# Patient Record
Sex: Female | Born: 1965 | Race: White | Hispanic: No | Marital: Married | Smoking: Never smoker
Health system: Southern US, Community
[De-identification: ages and names within clinical notes are randomized; demographics above are authoritative.]

## PROBLEM LIST (undated history)

## (undated) DIAGNOSIS — M1990 Unspecified osteoarthritis, unspecified site: Secondary | ICD-10-CM

## (undated) DIAGNOSIS — F329 Major depressive disorder, single episode, unspecified: Secondary | ICD-10-CM

## (undated) DIAGNOSIS — F32A Depression, unspecified: Secondary | ICD-10-CM

## (undated) HISTORY — PX: BUNIONECTOMY: SHX129

## (undated) HISTORY — DX: Unspecified osteoarthritis, unspecified site: M19.90

## (undated) HISTORY — PX: TUBAL LIGATION: SHX77

---

## 1997-09-30 ENCOUNTER — Other Ambulatory Visit: Admission: RE | Admit: 1997-09-30 | Discharge: 1997-09-30 | Payer: Self-pay | Admitting: Obstetrics and Gynecology

## 1998-07-15 ENCOUNTER — Inpatient Hospital Stay (HOSPITAL_COMMUNITY): Admission: AD | Admit: 1998-07-15 | Discharge: 1998-07-17 | Payer: Self-pay | Admitting: Obstetrics and Gynecology

## 1998-08-24 ENCOUNTER — Other Ambulatory Visit: Admission: RE | Admit: 1998-08-24 | Discharge: 1998-08-24 | Payer: Self-pay | Admitting: Obstetrics and Gynecology

## 1999-09-30 ENCOUNTER — Other Ambulatory Visit: Admission: RE | Admit: 1999-09-30 | Discharge: 1999-09-30 | Payer: Self-pay | Admitting: Obstetrics and Gynecology

## 2000-10-03 ENCOUNTER — Other Ambulatory Visit: Admission: RE | Admit: 2000-10-03 | Discharge: 2000-10-03 | Payer: Self-pay | Admitting: Obstetrics and Gynecology

## 2001-09-26 ENCOUNTER — Other Ambulatory Visit: Admission: RE | Admit: 2001-09-26 | Discharge: 2001-09-26 | Payer: Self-pay | Admitting: Obstetrics and Gynecology

## 2002-10-14 ENCOUNTER — Other Ambulatory Visit: Admission: RE | Admit: 2002-10-14 | Discharge: 2002-10-14 | Payer: Self-pay | Admitting: Obstetrics and Gynecology

## 2003-06-09 ENCOUNTER — Encounter: Admission: RE | Admit: 2003-06-09 | Discharge: 2003-06-09 | Payer: Self-pay | Admitting: Sports Medicine

## 2003-06-23 ENCOUNTER — Encounter: Admission: RE | Admit: 2003-06-23 | Discharge: 2003-06-23 | Payer: Self-pay | Admitting: Sports Medicine

## 2003-07-14 ENCOUNTER — Encounter: Admission: RE | Admit: 2003-07-14 | Discharge: 2003-07-14 | Payer: Self-pay | Admitting: Sports Medicine

## 2003-07-23 ENCOUNTER — Encounter: Admission: RE | Admit: 2003-07-23 | Discharge: 2003-07-23 | Payer: Self-pay | Admitting: Sports Medicine

## 2003-08-04 ENCOUNTER — Encounter: Admission: RE | Admit: 2003-08-04 | Discharge: 2003-08-04 | Payer: Self-pay | Admitting: Sports Medicine

## 2003-09-01 ENCOUNTER — Encounter: Admission: RE | Admit: 2003-09-01 | Discharge: 2003-09-01 | Payer: Self-pay | Admitting: Sports Medicine

## 2003-09-29 ENCOUNTER — Ambulatory Visit: Payer: Self-pay | Admitting: Sports Medicine

## 2003-11-18 ENCOUNTER — Ambulatory Visit: Payer: Self-pay | Admitting: Sports Medicine

## 2003-11-30 ENCOUNTER — Other Ambulatory Visit: Admission: RE | Admit: 2003-11-30 | Discharge: 2003-11-30 | Payer: Self-pay | Admitting: Obstetrics and Gynecology

## 2004-10-24 ENCOUNTER — Other Ambulatory Visit: Admission: RE | Admit: 2004-10-24 | Discharge: 2004-10-24 | Payer: Self-pay | Admitting: Obstetrics and Gynecology

## 2004-12-07 ENCOUNTER — Ambulatory Visit: Payer: Self-pay | Admitting: Sports Medicine

## 2005-09-20 ENCOUNTER — Ambulatory Visit: Payer: Self-pay | Admitting: Sports Medicine

## 2009-04-21 ENCOUNTER — Ambulatory Visit: Payer: Self-pay | Admitting: Sports Medicine

## 2009-04-21 DIAGNOSIS — M7742 Metatarsalgia, left foot: Secondary | ICD-10-CM

## 2009-04-21 DIAGNOSIS — M7741 Metatarsalgia, right foot: Secondary | ICD-10-CM | POA: Insufficient documentation

## 2009-04-21 DIAGNOSIS — R269 Unspecified abnormalities of gait and mobility: Secondary | ICD-10-CM | POA: Insufficient documentation

## 2009-06-25 ENCOUNTER — Encounter: Payer: Self-pay | Admitting: Sports Medicine

## 2010-02-03 NOTE — Letter (Signed)
Summary: [facility]   Imported By: Marily Memos 07/14/2009 12:18:39  _____________________________________________________________________  External Attachment:    Type:   Image     Comment:   External Document

## 2010-02-03 NOTE — Assessment & Plan Note (Signed)
Summary: orthotics,mc   History of Present Illness: Patient has a history of bilateral bunions; s/p surgical repairs. Not complaining of bunion pain today. She notes fallen arches on standing with some abulatory dyscomfort of feet relieved by rest. Her gait is abnormal 2/2 to her acquired foot/ankle deformities. No paresthesias.   Physical Exam  General:  Well-developed,well-nourished,in no acute distress; alert,appropriate and cooperative throughout examination Msk:  ANKLES/FEET: Pes planus. Excessive pronation. Slight weakness on right plant/inv; otherwise nl strength.  GAIT: Forefoot landing on running. Slight out-turning right foot on running; decreased on orthotic insertion.   Impression & Recommendations:  Problem # 1:  OTHER ACQUIRED DEFORMITY OF ANKLE AND FOOT OTHER (ICD-736.79)  The patient was fitted for a standard, cushioned, size 10 CAMBRAY orthotic. The orthotic was subsequently heated. The patient was placed on the orthotic blank positioned on the orthotic stand. The patient was positioned in subtalar neutral position in a weight bearing stance. After completion of molding, a stable blue EVA base was applied to the orthotic blank. The blank was ground to a stable position for weight bearing.  - Daily heel raises and pidgeon-toe walking. Transition toward heel raises on stairs as tolerated.  - RTC as needed for any concerns.  Orders: [employer], each unit [phone]) Orthotic Materials, each unit [phone])  Problem # 2:  ABNORMALITY OF GAIT (ICD-781.2)  - Per item #1.  Orders: [employer], each unit [phone])

## 2010-10-04 ENCOUNTER — Ambulatory Visit (INDEPENDENT_AMBULATORY_CARE_PROVIDER_SITE_OTHER): Payer: Managed Care, Other (non HMO) | Admitting: Sports Medicine

## 2010-10-04 ENCOUNTER — Encounter: Payer: Self-pay | Admitting: Sports Medicine

## 2010-10-04 DIAGNOSIS — M216X9 Other acquired deformities of unspecified foot: Secondary | ICD-10-CM

## 2010-10-04 DIAGNOSIS — M79661 Pain in right lower leg: Secondary | ICD-10-CM

## 2010-10-04 DIAGNOSIS — R269 Unspecified abnormalities of gait and mobility: Secondary | ICD-10-CM

## 2010-10-04 DIAGNOSIS — M79609 Pain in unspecified limb: Secondary | ICD-10-CM

## 2010-10-04 NOTE — Progress Notes (Signed)
  Subjective:    Patient ID: Kelly Rice, female    DOB: September 17, 1965, 45 y.o.   MRN: [id number]  HPI The patient comes in for new orthotics. She doesn't really have any complaints or any pain, however does have some minimal tightness in her soleus muscle after running. She's currently training 30-40 miles per week and has a [place] marathon coming up in November 4.  On inspection of her old orthotics, I do note that the memory plastic has fractured on both sides.    Review of Systems    no fevers, chills, night sweats, weight loss. Objective:   Physical Exam General: Well-developed, well-nourished Caucasian female in no acute distress. Skin: Warm and dry. Neuro: Alert and oriented x3, extraocular muscles intact. Respiratory:Not using accessory muscles.  Foot inspection and palpation reveals mild breakdown of the transverse arch and a drop of MT heads: Abnormal callous is present: Over first through fifth metatarsal heads No hallux valgus present. Minimal hammertoes. TTP: None  Running gait is neutral.  Patient was fitted for a : standard, cushioned, semi-rigid orthotic. The orthotic was heated and afterward the patient stood on the orthotic blank positioned on the orthotic stand. The patient was positioned in subtalar neutral position and 10 degrees of ankle dorsiflexion in a weight bearing stance. After completion of molding, a stable base was applied to the orthotic blank. The blank was ground to a stable position for weight bearing. Size: 9 Base: Blue EVA Posting: None Additional orthotic padding: None  Running gait continues to be neutral with new orthotics, and there is no pain.    Assessment & Plan:

## 2010-10-04 NOTE — Assessment & Plan Note (Signed)
See above

## 2010-10-04 NOTE — Assessment & Plan Note (Addendum)
Orthotics as above. Calf sleeve bilaterally. No other modification or treatment needed.

## 2011-02-13 ENCOUNTER — Ambulatory Visit (INDEPENDENT_AMBULATORY_CARE_PROVIDER_SITE_OTHER): Payer: Managed Care, Other (non HMO) | Admitting: Family Medicine

## 2011-02-13 VITALS — BP 100/60

## 2011-02-13 DIAGNOSIS — M25569 Pain in unspecified knee: Secondary | ICD-10-CM

## 2011-02-14 DIAGNOSIS — S83209A Unspecified tear of unspecified meniscus, current injury, unspecified knee, initial encounter: Secondary | ICD-10-CM | POA: Insufficient documentation

## 2011-02-14 NOTE — Progress Notes (Signed)
  Subjective:    Patient ID: Kelly Rice, female    DOB: 03-Dec-1965, 46 y.o.   MRN: [id number]  HPI  RIGHT knee pain Had a little pain noted anteriorly about 4 days ago but then did a half marathon and had significant right knee pain the last 2 miles. Was only able to hobble to the finish line. Knee felt swollen and  she had pain with bearing weight the following day.  Today the pain is improving some as the day goes along, it is still swollen. She has not run in the last 2 days. She typically runs 30 miles per week. Her next planned  race is March 17, half marathon. No unusual changes in training recently, nothing unusual about the race she just did. Had completed hill section without pain and was on flat last 2 miles when pain occurred---she "felt/heard" a pop  and noticed immediate pain.  PERTINENT  PMH / PSH: No prior knee injury, no prior knee surgery.  Review of Systems Pertinent review of systems: Denies warmth or redness of the right knee joint and of the right lower extremity.  Notes no numbness or tingling in the right lower extremity.No calf pain, no calf bruising.  Nonsmoker    negative for fever or unusual weight change.  Objective:   Physical Exam  Vital signs reviewed. GENERAL: Well developed, well nourished, no acute distress. Normal body habitus KNEE: Right. Full range of motion in flexion extension and this is painless range of motion. No effusion. No blocking of the kneecap. Quadricep muscle bulk and strength is bilaterally symmetrical. There is no tenderness along the quadriceps or patellar tendon. Neither the medial more the lateral minus tender. There is mild tenderness in the posterior lateral joint line that is somewhat diffuse but definitely not associated with the hamstring. McMurray is negative. Lachman is normal. Ligamentously intact to varus and valgus stress  Left knee FROM, no effusion, no erythema or warmth, ligamentously intact to varus and valgus  stress. MSK: Hamstring strength B 5/5. No ttp over insertion on right. No defect of hamstring. ULTRASOUND:  Patellar and quadricep tendon is intact. Effusion noted in the suprapatellar pouch and also noted at the joint lines bilaterally. The medial meniscus has a questionable pathologic mid substance tear, the lateral meniscus appears intact. Posterior popliteal space on the lateral side is benign. Normal vascular flow in popliteal artery      Assessment & Plan:  #1. Knee pain with significant effusion.  ? Meniscal injury . Given the effusion she mostly has some intra-articular pathology going on. The medial meniscus looked a little odd on ultrasound.  I suspect that is an old finding as she has no tenderness there. McMurray is negative but she does have effusion which could make this falsely negative. We discussed options and she decided to go for conservative treatment for the next week or so. If she continues to improve she'll do it 4-5 mile run in 7 days. If that goes well, then we are done. If that does not go well she will call and we will consider other options which included MRI and corticosteroid injection. I would favor MRI as I think she would. We did discuss that if she does not improve rapidly, the . March 17 race may be out of the question and she is okay with that. She will continue to ICE tid.

## 2011-02-20 ENCOUNTER — Ambulatory Visit (INDEPENDENT_AMBULATORY_CARE_PROVIDER_SITE_OTHER): Payer: Managed Care, Other (non HMO) | Admitting: Family Medicine

## 2011-02-20 ENCOUNTER — Encounter: Payer: Self-pay | Admitting: Family Medicine

## 2011-02-20 VITALS — BP 103/68 | HR 66

## 2011-02-20 DIAGNOSIS — M25561 Pain in right knee: Secondary | ICD-10-CM

## 2011-02-20 DIAGNOSIS — M25569 Pain in unspecified knee: Secondary | ICD-10-CM

## 2011-02-20 NOTE — Progress Notes (Signed)
  Subjective:    Patient ID: Kelly Rice, female    DOB: 1965-10-11, 46 y.o.   MRN: [id number]  HPI  Followup right knee pain. Is about 20-30% better. Still very stiff particularly if she tries to bend it all the way. She can walk without problem but if she tries to even a few steps around and she has fairly intense pain. She's had no new symptoms develop.  Review of Systems   noted no erythema or warmth of the knee. The swelling has improved.  Objective:   Physical Exam  Vital signs reviewed. GENERAL: Well developed, well nourished, no acute distress KNEE: Right knee still has some mild effusion. Posteriorly medially she is tender to palpation but there is no mass here. There is no Baker's cyst. The popliteal spaces otherwise normal. There is mild tenderness on the posterior medial joint line.      Assessment & Plan:  Knee pain with effusion. Long discussion. We also decided to consider MRI. She's taking with her insurance coming to see if she can afford that. She'll let me know. My main concern is for medial meniscal tear.

## 2011-02-22 ENCOUNTER — Ambulatory Visit
Admission: RE | Admit: 2011-02-22 | Discharge: 2011-02-22 | Disposition: A | Discharge status: Home / Self Care | Payer: Managed Care, Other (non HMO) | Source: Ambulatory Visit | Attending: Family Medicine | Admitting: Family Medicine

## 2011-02-22 DIAGNOSIS — M25561 Pain in right knee: Secondary | ICD-10-CM

## 2011-02-23 ENCOUNTER — Telehealth: Payer: Self-pay | Admitting: Family Medicine

## 2011-02-23 DIAGNOSIS — S83209A Unspecified tear of unspecified meniscus, current injury, unspecified knee, initial encounter: Secondary | ICD-10-CM

## 2011-02-23 NOTE — Telephone Encounter (Signed)
Pt returned Dr Donnetta Hail call

## 2011-03-06 ENCOUNTER — Encounter (HOSPITAL_BASED_OUTPATIENT_CLINIC_OR_DEPARTMENT_OTHER): Payer: Self-pay | Admitting: *Deleted

## 2011-03-07 ENCOUNTER — Other Ambulatory Visit: Payer: Self-pay | Admitting: Orthopedic Surgery

## 2011-03-08 ENCOUNTER — Ambulatory Visit (HOSPITAL_BASED_OUTPATIENT_CLINIC_OR_DEPARTMENT_OTHER)
Admission: RE | Admit: 2011-03-08 | Discharge: 2011-03-08 | Disposition: A | Discharge status: Home / Self Care | Payer: Managed Care, Other (non HMO) | Source: Ambulatory Visit | Attending: Orthopedic Surgery | Admitting: Orthopedic Surgery

## 2011-03-08 ENCOUNTER — Ambulatory Visit (HOSPITAL_BASED_OUTPATIENT_CLINIC_OR_DEPARTMENT_OTHER): Payer: Managed Care, Other (non HMO) | Admitting: Anesthesiology

## 2011-03-08 ENCOUNTER — Encounter (HOSPITAL_BASED_OUTPATIENT_CLINIC_OR_DEPARTMENT_OTHER): Payer: Self-pay | Admitting: Anesthesiology

## 2011-03-08 ENCOUNTER — Encounter (HOSPITAL_BASED_OUTPATIENT_CLINIC_OR_DEPARTMENT_OTHER): Payer: Self-pay | Admitting: *Deleted

## 2011-03-08 ENCOUNTER — Encounter (HOSPITAL_BASED_OUTPATIENT_CLINIC_OR_DEPARTMENT_OTHER)
Admission: RE | Disposition: A | Discharge status: Home / Self Care | Payer: Self-pay | Source: Ambulatory Visit | Attending: Orthopedic Surgery

## 2011-03-08 DIAGNOSIS — M1710 Unilateral primary osteoarthritis, unspecified knee: Secondary | ICD-10-CM | POA: Insufficient documentation

## 2011-03-08 DIAGNOSIS — M23329 Other meniscus derangements, posterior horn of medial meniscus, unspecified knee: Secondary | ICD-10-CM | POA: Insufficient documentation

## 2011-03-08 DIAGNOSIS — F329 Major depressive disorder, single episode, unspecified: Secondary | ICD-10-CM | POA: Insufficient documentation

## 2011-03-08 DIAGNOSIS — F3289 Other specified depressive episodes: Secondary | ICD-10-CM | POA: Insufficient documentation

## 2011-03-08 DIAGNOSIS — M2240 Chondromalacia patellae, unspecified knee: Secondary | ICD-10-CM | POA: Insufficient documentation

## 2011-03-08 HISTORY — PX: KNEE ARTHROSCOPY: SHX127

## 2011-03-08 HISTORY — DX: Major depressive disorder, single episode, unspecified: F32.9

## 2011-03-08 HISTORY — DX: Depression, unspecified: F32.A

## 2011-03-08 SURGERY — ARTHROSCOPY, KNEE
Anesthesia: General | Site: Knee | Laterality: Right | Wound class: Clean

## 2011-03-08 MED ORDER — DEXAMETHASONE SODIUM PHOSPHATE 10 MG/ML IJ SOLN
INTRAMUSCULAR | Status: DC | PRN
  Administered 2011-03-08: 10 mg via INTRAVENOUS

## 2011-03-08 MED ORDER — ONDANSETRON HCL 4 MG/2ML IJ SOLN: 4.0000 mg | Freq: Once | INTRAMUSCULAR | Status: DC | PRN

## 2011-03-08 MED ORDER — MIDAZOLAM HCL 5 MG/5ML IJ SOLN
INTRAMUSCULAR | Status: DC | PRN
  Administered 2011-03-08: 2 mg via INTRAVENOUS

## 2011-03-08 MED ORDER — ONDANSETRON HCL 4 MG/2ML IJ SOLN
INTRAMUSCULAR | Status: DC | PRN
  Administered 2011-03-08: 4 mg via INTRAVENOUS

## 2011-03-08 MED ORDER — FENTANYL CITRATE 0.05 MG/ML IJ SOLN
INTRAMUSCULAR | Status: DC | PRN
  Administered 2011-03-08: 100 ug via INTRAVENOUS
  Administered 2011-03-08: 50 ug via INTRAVENOUS

## 2011-03-08 MED ORDER — PROPOFOL 10 MG/ML IV EMUL
INTRAVENOUS | Status: DC | PRN
  Administered 2011-03-08: 300 mg via INTRAVENOUS
  Administered 2011-03-08: 50 mg via INTRAVENOUS

## 2011-03-08 MED ORDER — OXYCODONE-ACETAMINOPHEN 5-325 MG PO TABS: 1.0000 | ORAL_TABLET | Freq: Four times a day (QID) | ORAL | Status: AC | PRN

## 2011-03-08 MED ORDER — OXYCODONE-ACETAMINOPHEN 5-325 MG PO TABS
1.0000 | ORAL_TABLET | ORAL | Status: AC | PRN
  Administered 2011-03-08: 1 via ORAL

## 2011-03-08 MED ORDER — LIDOCAINE HCL (CARDIAC) 20 MG/ML IV SOLN
INTRAVENOUS | Status: DC | PRN
  Administered 2011-03-08: 60 mg via INTRAVENOUS

## 2011-03-08 MED ORDER — LACTATED RINGERS IV SOLN
INTRAVENOUS | Status: DC
  Administered 2011-03-08 (×2): via INTRAVENOUS

## 2011-03-08 MED ORDER — BUPIVACAINE HCL (PF) 0.5 % IJ SOLN
INTRAMUSCULAR | Status: DC | PRN
  Administered 2011-03-08: 20 mL

## 2011-03-08 MED ORDER — POVIDONE-IODINE 7.5 % EX SOLN: Freq: Once | CUTANEOUS | Status: DC

## 2011-03-08 MED ORDER — HYDROMORPHONE HCL PF 1 MG/ML IJ SOLN: 0.2500 mg | INTRAMUSCULAR | Status: DC | PRN

## 2011-03-08 MED ORDER — SODIUM CHLORIDE 0.9 % IR SOLN
Status: DC | PRN
  Administered 2011-03-08 (×2)

## 2011-03-08 MED ORDER — MORPHINE SULFATE 2 MG/ML IJ SOLN: 0.0500 mg/kg | INTRAMUSCULAR | Status: DC | PRN

## 2011-03-08 MED ORDER — CLINDAMYCIN PHOSPHATE 600 MG/50ML IV SOLN: 600.0000 mg | INTRAVENOUS | Status: DC

## 2011-03-08 SURGICAL SUPPLY — 40 items
BANDAGE ELASTIC 6 VELCRO ST LF (GAUZE/BANDAGES/DRESSINGS) ×2 IMPLANT
BLADE 4.2CUDA (BLADE) IMPLANT
BLADE GREAT WHITE 4.2 (BLADE) ×2 IMPLANT
CANISTER OMNI JUG 16 LITER (MISCELLANEOUS) IMPLANT
CANISTER SUCTION 2500CC (MISCELLANEOUS) IMPLANT
CLOTH BEACON ORANGE TIMEOUT ST (SAFETY) ×2 IMPLANT
CUTTER MENISCUS  4.2MM (BLADE)
CUTTER MENISCUS 4.2MM (BLADE) IMPLANT
DRAPE ARTHROSCOPY W/POUCH 114 (DRAPES) ×2 IMPLANT
DRSG EMULSION OIL 3X3 NADH (GAUZE/BANDAGES/DRESSINGS) ×2 IMPLANT
DURAPREP 26ML APPLICATOR (WOUND CARE) ×2 IMPLANT
ELECT MENISCUS 165MM 90D (ELECTRODE) IMPLANT
ELECT REM PT RETURN 9FT ADLT (ELECTROSURGICAL)
ELECTRODE REM PT RTRN 9FT ADLT (ELECTROSURGICAL) IMPLANT
GLOVE BIO SURGEON STRL SZ 6.5 (GLOVE) ×1 IMPLANT
GLOVE BIOGEL PI IND STRL 7.0 (GLOVE) ×1 IMPLANT
GLOVE BIOGEL PI IND STRL 8 (GLOVE) ×2 IMPLANT
GLOVE BIOGEL PI INDICATOR 7.0 (GLOVE) ×1
GLOVE BIOGEL PI INDICATOR 8 (GLOVE) ×2
GLOVE ECLIPSE 7.5 STRL STRAW (GLOVE) ×4 IMPLANT
GOWN BRE IMP PREV XXLGXLNG (GOWN DISPOSABLE) ×2 IMPLANT
GOWN PREVENTION PLUS XLARGE (GOWN DISPOSABLE) ×4 IMPLANT
GOWN PREVENTION PLUS XXLARGE (GOWN DISPOSABLE) ×1 IMPLANT
HOLDER KNEE FOAM BLUE (MISCELLANEOUS) ×2 IMPLANT
KNEE WRAP E Z 3 GEL PACK (MISCELLANEOUS) ×2 IMPLANT
NDL SAFETY ECLIPSE 18X1.5 (NEEDLE) ×1 IMPLANT
NEEDLE HYPO 18GX1.5 SHARP (NEEDLE) ×2
PACK ARTHROSCOPY DSU (CUSTOM PROCEDURE TRAY) ×2 IMPLANT
PACK BASIN DAY SURGERY FS (CUSTOM PROCEDURE TRAY) ×2 IMPLANT
PAD CAST 4YDX4 CTTN HI CHSV (CAST SUPPLIES) ×1 IMPLANT
PADDING CAST COTTON 4X4 STRL (CAST SUPPLIES) ×2
PENCIL BUTTON HOLSTER BLD 10FT (ELECTRODE) IMPLANT
SET ARTHROSCOPY TUBING (MISCELLANEOUS) ×2
SET ARTHROSCOPY TUBING LN (MISCELLANEOUS) ×1 IMPLANT
SPONGE GAUZE 4X4 12PLY (GAUZE/BANDAGES/DRESSINGS) ×2 IMPLANT
SUT ETHILON 4 0 PS 2 18 (SUTURE) IMPLANT
SYR 5ML LL (SYRINGE) ×2 IMPLANT
TOWEL OR 17X24 6PK STRL BLUE (TOWEL DISPOSABLE) ×2 IMPLANT
TOWEL OR NON WOVEN STRL DISP B (DISPOSABLE) ×2 IMPLANT
WATER STERILE IRR 1000ML POUR (IV SOLUTION) ×2 IMPLANT

## 2011-03-08 NOTE — Anesthesia Postprocedure Evaluation (Signed)
  Anesthesia Post-op Note  Patient: Kelly Rice  Procedure(s) Performed: Procedure(s) (LRB): ARTHROSCOPY KNEE (Right)  Patient Location: PACU  Anesthesia Type: General  Level of Consciousness: sedated  Airway and Oxygen Therapy: Patient Spontanous Breathing and Patient connected to nasal cannula oxygen  Post-op Pain: mild  Post-op Assessment: Post-op Vital signs reviewed, Patient's Cardiovascular Status Stable, Respiratory Function Stable, Patent Airway, No signs of Nausea or vomiting and Pain level controlled  Post-op Vital Signs: Reviewed and stable  Complications: No apparent anesthesia complications

## 2011-03-08 NOTE — Anesthesia Procedure Notes (Signed)
Procedure Name: LMA Insertion Date/Time: 03/08/2011 12:14 PM Performed by: Signa Kell Pre-anesthesia Checklist: Patient identified, Emergency Drugs available, Suction available, Patient being monitored and Timeout performed Patient Re-evaluated:Patient Re-evaluated prior to inductionOxygen Delivery Method: Circle System Utilized Preoxygenation: Pre-oxygenation with 100% oxygen Intubation Type: IV induction Ventilation: Mask ventilation without difficulty LMA: LMA inserted LMA Size: 4.0 Number of attempts: 1 Airway Equipment and Method: bite block Placement Confirmation: positive ETCO2 and breath sounds checked- equal and bilateral Tube secured with: Tape Dental Injury: Teeth and Oropharynx as per pre-operative assessment

## 2011-03-08 NOTE — Discharge Instructions (Signed)
POST-OP KNEE ARTHROSCOPY INSTRUCTIONS  Dr. Cherlyn Roberts PA-C  Pain You will be expected to have a moderate amount of pain in the affected knee for approximately two weeks. However, the first two days will be the most severe pain. A prescription has been provided to take as needed for the pain. The pain can be reduced by applying ice packs to the knee for the first 1-2 weeks post surgery. Also, keeping the leg elevated on pillows will help alleviate the pain. If you develop any acute pain or swelling in your calf muscle, please call the doctor.  Activity It is preferred that you stay at bed rest for approximately 24 hours. However, you may go to the bathroom with help. Weight bearing as tolerated. You may begin the knee exercises the day of surgery. Discontinue crutches as the knee pain resolves.  Dressing Keep the dressing dry. If the ace bandage should wrinkle or roll up, this can be rewrapped to prevent ridges in the bandage. You may remove all dressings in 48 hours,  apply bandaids to each wound. You may shower on the 4th day after surgery but no tub bath.  Symptoms to report to your doctor Extreme pain Extreme swelling Temperature above 101 degrees Change in the feeling, color, or movement of your toes Redness, heat, or swelling at your incision  Exercise If is preferred that as soon as possible you try to do a straight leg raise without bending the knee and concentrate on bringing the heel of your foot off the bed up to approximately 45 degrees and hold for the count of 10 seconds. Repeat this at least 10 times three or four times per day. Additional exercises are provided below.  You are encouraged to bend the knee as tolerated.  Follow-Up Call to schedule a follow-up appointment in 5-7 days. Our office # is [phone].  POST-OP EXERCISES  Short Arc Quads  1. Lie on back with legs straight. Place towel roll under thigh, just above knee. 2. Tighten thigh muscles to  straighten knee and lift heel off bed. 3. Hold for slow count of five, then lower. 4. Do three sets of ten    Straight Leg Raises  1. Lie on back with operative leg straight and other leg bent. 2. Keeping operative leg completely straight, slowly lift operative leg so foot is 5 inches off bed. 3. Hold for slow count of five, then lower. 4. Do three sets of ten.    DO BOTH EXERCISES 2 TIMES A DAY  Ankle Pumps  Work/move the operative ankle and foot up and down 10 times every hour while [facility]  [address] [place], [place] [address] [phone]   Post Anesthesia Home Care Instructions  Activity: Get plenty of rest for the remainder of the day. A responsible adult should stay with you for 24 hours following the procedure.  For the next 24 hours, DO NOT: -Drive a car -Advertising copywriter -Drink alcoholic beverages -Take any medication unless instructed by your physician -Make any legal decisions or sign important papers.  Meals: Start with liquid foods such as gelatin or soup. Progress to regular foods as tolerated. Avoid greasy, spicy, heavy foods. If nausea and/or vomiting occur, drink only clear liquids until the nausea and/or vomiting subsides. Call your physician if vomiting continues.  Special Instructions/Symptoms: Your throat may feel dry or sore from the anesthesia or the breathing tube placed in your throat during surgery. If this causes discomfort, gargle with  warm salt water. The discomfort should disappear within 24 hours.  Call your surgeon if you experience:   1.  Fever over 101.0. 2.  Inability to urinate. 3.  Nausea and/or vomiting. 4.  Extreme swelling or bruising at the surgical site. 5.  Continued bleeding from the incision. 6.  Increased pain, redness or drainage from the incision. 7.  Problems related to your pain medication. Marland Kitchen

## 2011-03-08 NOTE — Brief Op Note (Addendum)
03/08/2011  12:59 PM  PATIENT:  Gloriajean Dell  46 y.o. female  PRE-OPERATIVE DIAGNOSIS:  Medial Meniscal Tear Right Knee  POST-OPERATIVE DIAGNOSIS:  Medial Meniscal Tear Right Knee  PROCEDURE:  Procedure(s) (LRB): ARTHROSCOPY KNEE (Right) with medial menisectomy, chondroplasty of medial femoral condyle, and patella  SURGEON:  Surgeon(s) and Role:    * Harvie Junior, MD - Primary  PHYSICIAN ASSISTANT: Bethune PA-C  ANESTHESIA:   general  EBL:  Total I/O In: 1000 [I.V.:1000] Out: -   BLOOD ADMINISTERED:none  DRAINS: none   LOCAL MEDICATIONS USED:  MARCAINE        TOURNIQUET:  * No tourniquets in log *  DICTATION: .Other Dictation: Dictation Number   PLAN OF CARE: Discharge to home after PACU  PATIENT DISPOSITION:  PACU - hemodynamically stable.     Dictation number [id number]

## 2011-03-08 NOTE — Progress Notes (Signed)
Pt became light headed cool and clammy ,  [place] ,    95/50  60 12,  Pale/   Short lived,  Pt much improved at present  68 18 104/60 color good pt states feels much better

## 2011-03-08 NOTE — H&P (Signed)
PREOPERATIVE H&P  Chief Complaint: r. Knee pain  HPI: Kelly Rice is a 46 y.o. female who presents for evaluation of r. Knee pain. It has been present for 3 mon and has been worsening. She has failed conservative measures. Pain is rated as moderate.  Past Medical History  Diagnosis Date  . Depression    Past Surgical History  Procedure Date  . Bunionectomy     BIL  . Tubal ligation    History   Social History  . Marital Status: Married    Spouse Name: N/A    Number of Children: N/A  . Years of Education: N/A   Social History Main Topics  . Smoking status: Never Smoker   . Smokeless tobacco: Never Used  . Alcohol Use: Yes  . Drug Use: None  . Sexually Active: Yes   Other Topics Concern  . None   Social History Narrative  . None   History reviewed. No pertinent family history. Allergies  Allergen Reactions  . Codeine Nausea Only  . Penicillins Rash   Prior to Admission medications   Medication Sig Start Date End Date Taking? Authorizing Provider  buPROPion (WELLBUTRIN SR) 150 MG 12 hr tablet Take 150 mg by mouth every morning.   Yes Historical Provider, MD  FLUoxetine (PROZAC) 20 MG capsule Take 20 mg by mouth daily.   Yes Historical Provider, MD     Positive ROS: none  All other systems have been reviewed and were otherwise negative with the exception of those mentioned in the HPI and as above.  Physical Exam: Filed Vitals:   03/08/11 1009  BP: 139/82  Pulse: 73  Temp: 98 F (36.7 C)  Resp: 18    General: Alert, no acute distress Cardiovascular: No pedal edema Respiratory: No cyanosis, no use of accessory musculature GI: No organomegaly, abdomen is soft and non-tender Skin: No lesions in the area of chief complaint Neurologic: Sensation intact distally Psychiatric: Patient is competent for consent with normal mood and affect Lymphatic: No axillary or cervical lymphadenopathy  MUSCULOSKELETAL: R Knee: +TTP med + mcmurray  -instability  MRI: + mmt  Assessment/Plan: mmt right knee Plan for Procedure(s): ARTHROSCOPY KNEE  The risks benefits and alternatives were discussed with the patient including but not limited to the risks of nonoperative treatment, versus surgical intervention including infection, bleeding, nerve injury, malunion, nonunion, hardware prominence, hardware failure, need for hardware removal, blood clots, cardiopulmonary complications, morbidity, mortality, among others, and they were willing to proceed.  Predicted outcome is good, although there will be at least a six to nine month expected recovery.  Jahmire Ruffins L, MD 03/08/2011 11:29 AM

## 2011-03-08 NOTE — Anesthesia Preprocedure Evaluation (Addendum)
Anesthesia Evaluation  Patient identified by MRN, date of birth, ID band Patient awake    Reviewed: Allergy & Precautions, H&P , NPO status , Patient's Chart, lab work & pertinent test results  Airway  TM Distance: >3 FB Neck ROM: Full    Dental  (+) Teeth Intact and Dental Advisory Given   Pulmonary  breath sounds clear to auscultation        Cardiovascular Rhythm:Regular Rate:Normal     Neuro/Psych    GI/Hepatic   Endo/Other    Renal/GU      Musculoskeletal   Abdominal   Peds  Hematology   Anesthesia Other Findings   Reproductive/Obstetrics                           Anesthesia Physical Anesthesia Plan  ASA: I  Anesthesia Plan: General   Post-op Pain Management:    Induction: Intravenous  Airway Management Planned: LMA  Additional Equipment:   Intra-op Plan:   Post-operative Plan: Extubation in OR  Informed Consent: I have reviewed the patients History and Physical, chart, labs and discussed the procedure including the risks, benefits and alternatives for the proposed anesthesia with the patient or authorized representative who has indicated his/her understanding and acceptance.   Dental advisory given  Plan Discussed with: CRNA, Anesthesiologist and Surgeon  Anesthesia Plan Comments:         Anesthesia Quick Evaluation  

## 2011-03-08 NOTE — Transfer of Care (Signed)
Immediate Anesthesia Transfer of Care Note  Patient: Kelly Rice  Procedure(s) Performed: Procedure(s) (LRB): ARTHROSCOPY KNEE (Right)  Patient Location: PACU  Anesthesia Type: General  Level of Consciousness: sedated  Airway & Oxygen Therapy: Patient Spontanous Breathing and Patient connected to face mask oxygen  Post-op Assessment: Report given to PACU RN and Post -op Vital signs reviewed and stable  Post vital signs: Reviewed and stable  Complications: No apparent anesthesia complications

## 2011-03-09 ENCOUNTER — Encounter (HOSPITAL_BASED_OUTPATIENT_CLINIC_OR_DEPARTMENT_OTHER): Payer: Self-pay | Admitting: Orthopedic Surgery

## 2011-03-09 LAB — POCT HEMOGLOBIN-HEMACUE: Hemoglobin: 13.6 g/dL (ref 12.0–15.0)

## 2011-03-09 NOTE — Op Note (Signed)
NAME:  Kelly Rice, Kelly Rice                   ACCOUNT NO.:  MEDICAL RECORD NO.:  [id number]  LOCATION:                                 FACILITY:  PHYSICIAN:  Harvie Junior, M.D.        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  POSTOPERATIVE DIAGNOSES: 1. 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 2. Chondromalacia of the patellofemoral joint, in particular on the     undersurface of the patella.  PROCEDURES: 1. Partial posterior horn medial meniscectomy with corresponding     debridement of the medial compartment. 2. Debridement by way of chondroplasty of the patellofemoral joint.  SURGEON:  Harvie Junior, MD  ASSISTANT:  Marshia Ly, PA  ANESTHESIA:  General.  BRIEF HISTORY:  Kelly Rice is a 46 year old female with a long history having been a significant runner.  Over the course of time, she has been having increasing pain.  Was doing a half marathon and heard a big pop in her knee.  She has been unable to run since that time.  She was evaluated by Dr. Denny Levy and was noted to have a significant meniscal tear.  MRI was obtained which showed she had chondromalacia as well as a meniscal tear.  After failure of conservative care,  she is taken to operating room for operative knee arthroscopy.  PROCEDURE:  The patient was taken to operating room. After adequate anesthesia was obtained with general anesthetic, the patient was placed supine on the operating table.  The right leg was then prepped and draped in usual sterile fashion.  Following this, routine arthroscopic examination of knee revealed there was an obvious chondromalacia of the patellofemoral joint with a significant grade 2 and some grade 3 changes on the posterior  aspect of the patella.  The trochlea looked reasonably well.  This was debrided minimally under this area just to take care of any loose or fragment pieces.  Attention turned medially, where there was chondromalacia of the medial femoral condyle which was identified. There was a large medial meniscal tear back by the meniscal root.  This was debrided with a combination of straight biting forces and upbiting forceps.  Remaining meniscal rim was contoured down with the suction shaver.  Attention turned to the medial femoral condyle where there was grade 2 change was seen, mostly in the flexion area.  When came into extension, there was 1 big flap of cartilage that was free and we had to go up with a probe and sort of free up this flap and then debrided this out.  Minimally, we tried to debride anything else to help smooth it down, but this gave a pretty big area of grade 2 and may be some early grade 3 change.  There was probably 2 x 2 cm in flexion, about 1 x 1 cm in the extended area. Following this, attention turned to the ACL. Normal lateral side.  Normal final check was made for loose and fragment pieces seen none. The knee was copiously and thoroughly lavaged, suctioned dry.  The arthroscopic portals were closed with a bandage.  A sterile compressive dressing was applied.  The patient taken to recovery, where she was noted to be in satisfactory condition.  Estimated blood loss for the procedure was none.     Harvie Junior, M.D.     Ranae Plumber  D:  03/08/2011  T:  03/09/2011  Job:  [id number]

## 2011-03-14 ENCOUNTER — Encounter (HOSPITAL_BASED_OUTPATIENT_CLINIC_OR_DEPARTMENT_OTHER): Payer: Self-pay

## 2012-01-15 ENCOUNTER — Other Ambulatory Visit: Payer: Self-pay | Admitting: Obstetrics and Gynecology

## 2012-01-15 ENCOUNTER — Ambulatory Visit
Admission: RE | Admit: 2012-01-15 | Discharge: 2012-01-15 | Disposition: A | Discharge status: Home / Self Care | Payer: Managed Care, Other (non HMO) | Source: Ambulatory Visit | Attending: Obstetrics and Gynecology | Admitting: Obstetrics and Gynecology

## 2012-01-15 DIAGNOSIS — N63 Unspecified lump in unspecified breast: Secondary | ICD-10-CM

## 2012-08-13 ENCOUNTER — Ambulatory Visit (INDEPENDENT_AMBULATORY_CARE_PROVIDER_SITE_OTHER): Payer: Managed Care, Other (non HMO) | Admitting: Family Medicine

## 2012-08-13 VITALS — BP 120/70 | Ht 65.0 in | Wt 135.0 lb

## 2012-08-13 DIAGNOSIS — M7742 Metatarsalgia, left foot: Secondary | ICD-10-CM

## 2012-08-13 DIAGNOSIS — M7750 Other enthesopathy of unspecified foot: Secondary | ICD-10-CM

## 2012-08-13 NOTE — Progress Notes (Signed)
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 She states that she first noticed pain just proximal to the great toe on the plantar surface of the foot about 2 weeks ago. She notes that when she places her weight on her metatarsals this pain is worse. She has 2 pairs of orthotics and keeps her newer pair in her running shoes and her older pair in her tennis shoes. Her newer pair is 46 years old now. She states that when she started to develop the pain she took her newer pair of orthotics and started using them in both her tennis shoes and running shoes. She notes that her symptoms improved when she did this. She has also rested over the weekend due to pain and has noticed her symptoms improved greatly with rest.  ROS: As above in the HPI. All other systems are stable or negative.  OBJECTIVE: APPEARANCE:  Patient in no acute distress.The patient appeared well nourished and normally developed. HEENT: No scleral icterus. Conjunctiva non-injected Resp: Non labored Skin: No rash MSK:  Left Foot exam:  - On inspection, there is noted to be collapse of the transverse arch on the left foot with a gap between the first and second toes. Transverse arch on the right is more intact. Longitudinal arch appears intact..  - When standing, patient has neutral arch without pronation. - There is tenderness to palpation over the soft tissue proximal to the first MTP joint but no other tenderness to palpation.  - Ankle range of motion is full without pain. - Strength is 5 out of 5 in ankle and foot.  - Neurovascular status normal.    ASSESSMENT: #1. Metatarsalgia   PLAN: Discussed with patient that metatarsalgia is a very challenging problem and tennis players as they do a lot of weightbearing on there for foot. However, patient seems to have improved already just with switching into her better pair of orthotics. We did  modify her orthotics today with a small metatarsal pad and first ray post for additional padding. She was encouraged to use her better pair of orthotics in both her running and tennis shoes while she is improving. If she desires, we would be happy to make her a new pair of orthotics after she attempts a trial in this newly modified pair. She will followup as needed if she would like [place] to make any further adjustments.

## 2012-08-13 NOTE — Patient Instructions (Addendum)
Thank you for coming in today  1. Try new metatarsal pad and 1st ray post 2. Call if you want a second pair

## 2012-09-03 ENCOUNTER — Ambulatory Visit (INDEPENDENT_AMBULATORY_CARE_PROVIDER_SITE_OTHER): Payer: Managed Care, Other (non HMO) | Admitting: Family Medicine

## 2012-09-03 VITALS — BP 100/60 | Ht 65.0 in | Wt 135.0 lb

## 2012-09-03 DIAGNOSIS — M7750 Other enthesopathy of unspecified foot: Secondary | ICD-10-CM

## 2012-09-03 DIAGNOSIS — M7741 Metatarsalgia, right foot: Secondary | ICD-10-CM

## 2012-09-03 DIAGNOSIS — M25579 Pain in unspecified ankle and joints of unspecified foot: Secondary | ICD-10-CM

## 2012-09-03 NOTE — Progress Notes (Signed)
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 She states that she has been wearing the support insoles with metatarsal pad and first ray post that I don't for her. She notes that her foot pain has significantly improved since that time. She is able to return to running and tennis. She has no night pain. She is not requiring any medications for pain at this point. She is very pleased with how she is doing.  ROS: As above in the HPI. All other systems are stable or negative.  OBJECTIVE: APPEARANCE:  Patient in no acute distress.The patient appeared well nourished and normally developed. HEENT: No scleral icterus. Conjunctiva non-injected Resp: Non labored Skin: No rash MSK:  Bilateral feet: Neutral longitudinal arch. Collapse of the transverse arch. No tenderness to palpation over the bones. No pain on metatarsal squeeze. Full range of motion of the ankle and foot in the toes. Normal peripheral perfusion. Nonantalgic gait.  No hallux rigidus  ASSESSMENT: #1. Foot pain and metatarsalgia, improved.   PLAN: Given that patient has had such drastic response to the sport insoles, she desires custom orthotics. I feel that this would be of great benefit to her as a more lasting solution to her transverse arch collapse and metatarsalgia. We did build custom orthotics today for her. She may continue normal activities and followup as needed.   Patient was fitted for a : standard, cushioned, semi-rigid orthotic. The orthotic was heated and afterward the patient stood on the orthotic blank positioned on the orthotic stand. The patient was positioned in subtalar neutral position and 10 degrees of ankle dorsiflexion in a weight bearing stance. After completion of molding, a stable base was applied to the orthotic blank. The blank was ground to a stable position for weight  bearing. Size: 8 Base: EVA Posting: First ray bilaterally Additional orthotic padding: Metatarsal pads  Greater than 40 min was spent in today's visit to re-evaluate foot pain and build custom orthotics.

## 2012-09-03 NOTE — Patient Instructions (Signed)
Thank you for coming in today  Please give [place] a call if you have any trouble with your orthotics and we will make adjustments.

## 2013-01-10 ENCOUNTER — Other Ambulatory Visit: Payer: Self-pay

## 2013-01-10 DIAGNOSIS — Z1231 Encounter for screening mammogram for malignant neoplasm of breast: Secondary | ICD-10-CM

## 2013-01-30 ENCOUNTER — Ambulatory Visit
Admission: RE | Admit: 2013-01-30 | Discharge: 2013-01-30 | Disposition: A | Discharge status: Home / Self Care | Payer: Managed Care, Other (non HMO) | Source: Ambulatory Visit

## 2013-01-30 DIAGNOSIS — Z1231 Encounter for screening mammogram for malignant neoplasm of breast: Secondary | ICD-10-CM

## 2013-02-04 ENCOUNTER — Other Ambulatory Visit: Payer: Self-pay | Admitting: Obstetrics and Gynecology

## 2013-02-04 DIAGNOSIS — R928 Other abnormal and inconclusive findings on diagnostic imaging of breast: Secondary | ICD-10-CM

## 2013-02-07 ENCOUNTER — Ambulatory Visit
Admission: RE | Admit: 2013-02-07 | Discharge: 2013-02-07 | Disposition: A | Discharge status: Home / Self Care | Payer: Managed Care, Other (non HMO) | Source: Ambulatory Visit | Attending: Obstetrics and Gynecology | Admitting: Obstetrics and Gynecology

## 2013-02-07 DIAGNOSIS — R928 Other abnormal and inconclusive findings on diagnostic imaging of breast: Secondary | ICD-10-CM

## 2014-01-27 ENCOUNTER — Other Ambulatory Visit: Payer: Self-pay

## 2014-01-27 DIAGNOSIS — Z1231 Encounter for screening mammogram for malignant neoplasm of breast: Secondary | ICD-10-CM

## 2014-02-02 ENCOUNTER — Ambulatory Visit
Admission: RE | Admit: 2014-02-02 | Discharge: 2014-02-02 | Disposition: A | Discharge status: Home / Self Care | Payer: Managed Care, Other (non HMO) | Source: Ambulatory Visit

## 2014-02-02 DIAGNOSIS — Z1231 Encounter for screening mammogram for malignant neoplasm of breast: Secondary | ICD-10-CM

## 2014-02-25 ENCOUNTER — Other Ambulatory Visit: Payer: Self-pay | Admitting: Obstetrics and Gynecology

## 2014-02-26 LAB — CYTOLOGY - PAP

## 2014-04-03 ENCOUNTER — Ambulatory Visit (INDEPENDENT_AMBULATORY_CARE_PROVIDER_SITE_OTHER): Payer: Managed Care, Other (non HMO) | Admitting: Family Medicine

## 2014-04-03 ENCOUNTER — Encounter (INDEPENDENT_AMBULATORY_CARE_PROVIDER_SITE_OTHER): Payer: Self-pay

## 2014-04-03 ENCOUNTER — Encounter: Payer: Self-pay | Admitting: Family Medicine

## 2014-04-03 VITALS — BP 118/70 | Ht 65.0 in | Wt 135.0 lb

## 2014-04-03 DIAGNOSIS — M7741 Metatarsalgia, right foot: Secondary | ICD-10-CM | POA: Diagnosis not present

## 2014-04-03 DIAGNOSIS — R269 Unspecified abnormalities of gait and mobility: Secondary | ICD-10-CM

## 2014-04-03 DIAGNOSIS — M7742 Metatarsalgia, left foot: Secondary | ICD-10-CM

## 2014-04-03 NOTE — Progress Notes (Signed)
  Kelly Rice - 49 y.o. female MRN [id number]  Date of birth: [birth year]  SUBJECTIVE:  Including CC & ROS.  Patient is a pleasant 49 year old female presenting today for custom orthotics. She has had these previous custom orthotics for the past 2 years. Patient denies any acute complaints. She finds that the metatarsal pads that she currently has her small and would like some additional cushioning in these. Otherwise she appreciates arch support. Patient is an avid runner and Firefighter.   ROS: Review of systems otherwise negative except for information present in HPI  HISTORY: Past Medical, Surgical, Social, and Family History Reviewed & Updated per EMR. Pertinent Historical Findings include: History of mild depression Patient's a nonsmoker  DATA REVIEWED: No other imaging available  PHYSICAL EXAM:  VS: BP:118/70 mmHg  HR: bpm  TEMP: ( )  RESP:   HT:5\' 5"  (165.1 cm)   WT:135 lb (61.236 kg)  BMI:22.5 FEET/ GAIT EXAM:  General: well nourished Skin of LE: warm; dry, no rashes, lesions, ecchymosis or erythema. Vascular: Dorsal pedal pulses 2+ bilaterally Neurologically: Sensation to light touch lower extremities equal and intact bilaterally.  Observation - no ecchymosis, no edema, or hematoma present  Palpation: mid TTP at bilateral metatarsal Normal ankle motion and strength bilaterally  Extension/flexion 5/5 strength bilaterally in toes Weight-bearing foot exam:  First ray: mid vaglus  Second ray: morton's foot Longitudinal arch: pes cavus Heel position: neutral  Leg Length discrepancy: symmetric Gait analysis:  Striking location: midfoot Foot motion: supination created by orthotics  ASSESSMENT & PLAN: See problem based charting & AVS for pt instructions. Impression: Gait abnormalities Pes cavus Metatarsalgia  Orthotic Fitting 2 pairs and Adjustment note: Patient was fitted for a : standard, cushioned, semi-rigid orthotic.  The orthotic was heated and afterward  the patient stood on the orthotic blank positioned on the orthotic stand.  The patient was positioned in subtalar neutral position and 10 degrees of ankle dorsiflexion in a weight bearing stance.  After completion of molding, a stable base was applied to the orthotic blank.  The blank was ground to a stable position for weight bearing.  Size: 9 Base: Blue EVA Posting: 1st ray Additional orthotic padding: metatarsal padding  Greater than 50% of the patient's visit for a total of 40 minutes, was spent conducting face-to-face counseling for bilateral foot orthotics fitting and constructing

## 2014-05-01 ENCOUNTER — Ambulatory Visit: Payer: Managed Care, Other (non HMO) | Admitting: Family Medicine

## 2014-05-08 ENCOUNTER — Ambulatory Visit: Payer: Managed Care, Other (non HMO) | Admitting: Sports Medicine

## 2014-05-11 ENCOUNTER — Ambulatory Visit (INDEPENDENT_AMBULATORY_CARE_PROVIDER_SITE_OTHER): Payer: Managed Care, Other (non HMO) | Admitting: Family Medicine

## 2014-05-11 ENCOUNTER — Encounter: Payer: Self-pay | Admitting: Family Medicine

## 2014-05-11 VITALS — BP 108/64 | HR 68 | Ht 65.0 in | Wt 135.0 lb

## 2014-05-11 DIAGNOSIS — M79641 Pain in right hand: Secondary | ICD-10-CM | POA: Diagnosis not present

## 2014-05-11 NOTE — Patient Instructions (Signed)
Nice to meet you. Please wear the thumb splint that has been provided.  No tennis for 10-14 days.  If you are not better in 2 weeks please follow-up.

## 2014-05-11 NOTE — Progress Notes (Signed)
   Subjective:    Patient ID: Kelly Rice, female    DOB: 29-Mar-1965, 49 y.o.   MRN: [id number]  HPI Patient is a 49 yo female presenting with right hand and wrist pain. Started 2 months ago. No specific injury. Posterior wrist pain was only present when extending wrist while hitting tennis ball. That improved with ice and aleve. Hand pain is in first web space in the muscle. No joint pain. Hurts when she attempts to grip anything that is in an upright position or when twisting a top off a jar. No swelling. Plays tennis 3-4x/week. No radiation of pain. No tingling or numbness.    Review of Systems     Objective:   Physical Exam  Constitutional: She appears well-developed and well-nourished. No distress.  HENT:  Head: Normocephalic and atraumatic.  Musculoskeletal:  Right hand with no swelling or erythema, patient reports no pain, though has a different feeling on palpation of the muscles of the dorsal first web space, normal ROM of right thumb and first MCP, no pain on ROM, no swelling or tenderness of right wrist, no tenderness of metacarpals or anatomical snuff box, negative tenel sign Left hand with no swelling, erythema, or tenderness in any portion of the hand, normal ROM of left thumb and first MCP, no pain on ROM, no swelling or tenderness of left wrist, no tenderness of metacarpals or anatomical snuff box, negative tenel sign Bilateral hands warm and well perfused 2+ radial pulses bilaterally  Neurological:  5/5 strength bilateral thumb flexion, extension, abduction, and adduction, grip, finger abduction and adduction, wrist extension and flexion, biceps, and triceps, sensation to light touch intact bilateral UE       Assessment & Plan:

## 2014-05-11 NOTE — Assessment & Plan Note (Addendum)
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 I don't think this is a true nerve entrapment, but more likely nerve irritation from overuse during tennis. Alternatively this could be primary CMP arthritis of her dominant hand although that's less likely. We opted to treat her with a Universal thumb splint which will preclude her playing any tennis for the next 2 weeks. We'll see her back then. If her symptoms are not improving would consider further evaluation for radial nerve irritation or entrapment.

## 2014-05-12 NOTE — Progress Notes (Signed)
Patient ID: Kelly Rice, female   DOB: 04-Oct-1965, 49 y.o.   MRN: [id number] [place] Attending Note: I have seen and examined this patient. I have discussed this patient with the resident and reviewed the assessment and plan as documented above. I agree with the resident's findings and plan. Review of systems: Negative for warmth or swelling of her wrist, has had no other arthralgias. No unusual weight change. No fever, sweats, chills.  Marland Kitchen

## 2015-01-03 HISTORY — PX: KNEE ARTHROSCOPY: SUR90

## 2015-02-23 ENCOUNTER — Other Ambulatory Visit: Payer: Self-pay | Admitting: Chiropractic Medicine

## 2015-02-23 DIAGNOSIS — G8929 Other chronic pain: Secondary | ICD-10-CM

## 2015-02-23 DIAGNOSIS — M25562 Pain in left knee: Principal | ICD-10-CM

## 2015-02-28 ENCOUNTER — Ambulatory Visit
Admission: RE | Admit: 2015-02-28 | Discharge: 2015-02-28 | Disposition: A | Discharge status: Home / Self Care | Payer: Managed Care, Other (non HMO) | Source: Ambulatory Visit | Attending: Chiropractic Medicine | Admitting: Chiropractic Medicine

## 2015-02-28 DIAGNOSIS — G8929 Other chronic pain: Secondary | ICD-10-CM

## 2015-02-28 DIAGNOSIS — M25562 Pain in left knee: Principal | ICD-10-CM

## 2015-03-30 ENCOUNTER — Other Ambulatory Visit: Payer: Self-pay | Admitting: Obstetrics and Gynecology

## 2015-03-30 DIAGNOSIS — R928 Other abnormal and inconclusive findings on diagnostic imaging of breast: Secondary | ICD-10-CM

## 2015-04-02 ENCOUNTER — Ambulatory Visit
Admission: RE | Admit: 2015-04-02 | Discharge: 2015-04-02 | Disposition: A | Discharge status: Home / Self Care | Payer: Managed Care, Other (non HMO) | Source: Ambulatory Visit | Attending: Obstetrics and Gynecology | Admitting: Obstetrics and Gynecology

## 2015-04-02 ENCOUNTER — Other Ambulatory Visit: Payer: Self-pay | Admitting: Obstetrics and Gynecology

## 2015-04-02 DIAGNOSIS — R2232 Localized swelling, mass and lump, left upper limb: Secondary | ICD-10-CM

## 2015-04-02 DIAGNOSIS — N6320 Unspecified lump in the left breast, unspecified quadrant: Secondary | ICD-10-CM

## 2015-04-02 DIAGNOSIS — R928 Other abnormal and inconclusive findings on diagnostic imaging of breast: Secondary | ICD-10-CM

## 2015-04-06 ENCOUNTER — Other Ambulatory Visit: Payer: Self-pay | Admitting: Obstetrics and Gynecology

## 2015-04-06 DIAGNOSIS — N6320 Unspecified lump in the left breast, unspecified quadrant: Secondary | ICD-10-CM

## 2015-04-07 ENCOUNTER — Ambulatory Visit
Admission: RE | Admit: 2015-04-07 | Discharge: 2015-04-07 | Disposition: A | Discharge status: Home / Self Care | Payer: Managed Care, Other (non HMO) | Source: Ambulatory Visit | Attending: Obstetrics and Gynecology | Admitting: Obstetrics and Gynecology

## 2015-04-07 DIAGNOSIS — N6320 Unspecified lump in the left breast, unspecified quadrant: Secondary | ICD-10-CM

## 2015-04-07 DIAGNOSIS — R2232 Localized swelling, mass and lump, left upper limb: Secondary | ICD-10-CM

## 2015-04-07 HISTORY — PX: BREAST BIOPSY: SHX20

## 2015-09-03 ENCOUNTER — Other Ambulatory Visit: Payer: Self-pay | Admitting: Chiropractic Medicine

## 2015-09-03 DIAGNOSIS — M25521 Pain in right elbow: Secondary | ICD-10-CM

## 2015-09-12 ENCOUNTER — Ambulatory Visit
Admission: RE | Admit: 2015-09-12 | Discharge: 2015-09-12 | Disposition: A | Discharge status: Home / Self Care | Payer: Managed Care, Other (non HMO) | Source: Ambulatory Visit | Attending: Chiropractic Medicine | Admitting: Chiropractic Medicine

## 2015-09-12 DIAGNOSIS — M25521 Pain in right elbow: Secondary | ICD-10-CM

## 2015-09-15 ENCOUNTER — Other Ambulatory Visit: Payer: Managed Care, Other (non HMO)

## 2016-01-31 ENCOUNTER — Other Ambulatory Visit: Payer: Self-pay | Admitting: Obstetrics and Gynecology

## 2016-01-31 DIAGNOSIS — Z1231 Encounter for screening mammogram for malignant neoplasm of breast: Secondary | ICD-10-CM

## 2016-03-24 ENCOUNTER — Ambulatory Visit
Admission: RE | Admit: 2016-03-24 | Discharge: 2016-03-24 | Disposition: A | Discharge status: Home / Self Care | Payer: Managed Care, Other (non HMO) | Source: Ambulatory Visit | Attending: Obstetrics and Gynecology | Admitting: Obstetrics and Gynecology

## 2016-03-24 DIAGNOSIS — Z1231 Encounter for screening mammogram for malignant neoplasm of breast: Secondary | ICD-10-CM

## 2016-03-29 ENCOUNTER — Other Ambulatory Visit: Payer: Self-pay | Admitting: Neurology

## 2016-03-29 MED ORDER — BUPROPION HCL ER (XL) 300 MG PO TB24: 300.0000 mg | ORAL_TABLET | Freq: Every day | ORAL | 11 refills | Status: DC

## 2016-06-21 ENCOUNTER — Encounter: Payer: Self-pay | Admitting: Student

## 2016-06-21 ENCOUNTER — Ambulatory Visit (INDEPENDENT_AMBULATORY_CARE_PROVIDER_SITE_OTHER): Payer: Managed Care, Other (non HMO) | Admitting: Student

## 2016-06-21 VITALS — BP 102/60 | Ht 64.0 in | Wt 135.0 lb

## 2016-06-21 DIAGNOSIS — M25562 Pain in left knee: Secondary | ICD-10-CM

## 2016-06-21 DIAGNOSIS — R269 Unspecified abnormalities of gait and mobility: Secondary | ICD-10-CM

## 2016-06-21 NOTE — Assessment & Plan Note (Signed)
Believe her knee pain likely is due to her orthotics wearing down. We will try to do a trial of orthotics and see if this does not help her knee pain. If it does not or gets worse, would recommend follow-up.  Custom orthotics made, see below.

## 2016-06-21 NOTE — Progress Notes (Signed)
  Kelly Rice - 51 y.o. female MRN [id number]  Date of birth: 1965-10-23  SUBJECTIVE:  Including CC & ROS.  CC: left knee pain  Patient reports left knee pain that only occurs when she is running. She reported that sometimes it pinches. She has a history of a left knee arthroscopy with meniscectomy. She reports no pain as she had with the meniscus tear no swelling to the knee. She denies any acute injury. She has pain on the anterior of the knee just medial to the patella but not over the joint line.   ROS: No unexpected weight loss, fever, chills, swelling, instability, muscle pain, numbness/tingling, redness, otherwise see HPI   PMHx - Updated and reviewed.  Contributory factors include: Negative PSHx - Updated and reviewed.  Contributory factors include:  Negative FHx - Updated and reviewed.  Contributory factors include:  Negative Social Hx - Updated and reviewed. Contributory factors include: Negative Medications - reviewed   DATA REVIEWED: Previous office visits  PHYSICAL EXAM:  VS: BP:102/60  HR: bpm  TEMP: ( )  RESP:   HT:5\' 4"  (162.6 cm)   WT:135 lb (61.2 kg)  BMI:23.2 PHYSICAL EXAM: Gen: NAD, alert, cooperative with exam, well-appearing HEENT: clear conjunctiva,  CV:  no edema, capillary refill brisk, normal rate Resp: non-labored Skin: no rashes, normal turgor  Neuro: no gross deficits.  Psych:  alert and oriented  Knee: Normal to inspection with no erythema or effusion or obvious bony abnormalities. Palpation normal with no warmth, joint line tenderness, patellar tenderness, or condyle tenderness. Mild discomfort just medial to patellar tendon but not over medial joint line. ROM full in flexion and extension and lower leg rotation. Ligaments with solid consistent endpoints including ACL, PCL, LCL, MCL. Negative Mcmurray's, Apley's, and Thessalonian tests. Non painful patellar compression. Patellar glide without crepitus. Patellar and quadriceps tendons  unremarkable. Hamstring and quadriceps strength is normal.     ASSESSMENT & PLAN:   ABNORMALITY OF GAIT Believe her knee pain likely is due to her orthotics wearing down. We will try to do a trial of orthotics and see if this does not help her knee pain. If it does not or gets worse, would recommend follow-up.  Custom orthotics made, see below.  Patient was fitted for a standard, cushioned, semi-rigid orthotic. The orthotic was heated and afterward the patient stood on the orthotic blank positioned on the orthotic stand. The patient was positioned in subtalar neutral position and 10 degrees of ankle dorsiflexion in a weight bearing stance. After completion of molding, a stable base was applied to the orthotic blank. The blank was ground to a stable position for weight bearing. Size: 9 Base: Blue EVA Additional Posting and Padding: 1st ray post, metatarsal pad The patient ambulated these, and they were very comfortable.  I spent 40 minutes with this patient, greater than 50% was face-to-face time counseling regarding the below diagnosis.

## 2016-12-04 ENCOUNTER — Other Ambulatory Visit: Payer: Self-pay | Admitting: Neurology

## 2016-12-04 MED ORDER — BIMATOPROST 0.03 % EX SOLN
1.0000 [drp] | Freq: Every day | CUTANEOUS | 12 refills | Status: DC
Start: 2016-12-04 — End: 2016-12-04

## 2016-12-04 MED ORDER — BIMATOPROST 0.03 % EX SOLN: 1.0000 [drp] | Freq: Every day | CUTANEOUS | 12 refills | Status: DC

## 2016-12-04 MED ORDER — PHENTERMINE HCL 15 MG PO TBDP: 15.0000 mg | ORAL_TABLET | Freq: Every morning | ORAL | 4 refills | Status: DC

## 2016-12-04 MED ORDER — PHENTERMINE HCL 15 MG PO CAPS: 15.0000 mg | ORAL_CAPSULE | ORAL | 4 refills | Status: DC

## 2017-03-30 ENCOUNTER — Other Ambulatory Visit: Payer: Self-pay | Admitting: Neurology

## 2017-04-03 ENCOUNTER — Other Ambulatory Visit: Payer: Self-pay | Admitting: Neurology

## 2017-04-03 MED ORDER — BUPROPION HCL ER (XL) 300 MG PO TB24: 300.0000 mg | ORAL_TABLET | Freq: Every day | ORAL | 4 refills | Status: DC

## 2017-05-07 ENCOUNTER — Other Ambulatory Visit: Payer: Self-pay | Admitting: Obstetrics and Gynecology

## 2017-05-07 DIAGNOSIS — Z1231 Encounter for screening mammogram for malignant neoplasm of breast: Secondary | ICD-10-CM

## 2017-05-14 ENCOUNTER — Ambulatory Visit
Admission: RE | Admit: 2017-05-14 | Discharge: 2017-05-14 | Disposition: A | Discharge status: Home / Self Care | Payer: Managed Care, Other (non HMO) | Source: Ambulatory Visit | Attending: Obstetrics and Gynecology | Admitting: Obstetrics and Gynecology

## 2017-05-14 DIAGNOSIS — Z1231 Encounter for screening mammogram for malignant neoplasm of breast: Secondary | ICD-10-CM

## 2017-12-24 ENCOUNTER — Other Ambulatory Visit: Payer: Self-pay | Admitting: Neurology

## 2017-12-24 MED ORDER — FLUOXETINE HCL 40 MG PO CAPS: 40.0000 mg | ORAL_CAPSULE | Freq: Every day | ORAL | 4 refills | Status: DC

## 2017-12-24 MED ORDER — BUPROPION HCL ER (XL) 300 MG PO TB24
300.0000 mg | ORAL_TABLET | Freq: Every day | ORAL | 4 refills | Status: DC
Start: 2017-12-24 — End: 2018-04-17

## 2017-12-24 MED ORDER — PHENTERMINE HCL 15 MG PO CAPS
15.0000 mg | ORAL_CAPSULE | Freq: Two times a day (BID) | ORAL | 4 refills | Status: DC
Start: 2017-12-24 — End: 2020-02-05

## 2018-04-17 ENCOUNTER — Other Ambulatory Visit: Payer: Self-pay | Admitting: Neurology

## 2018-06-14 ENCOUNTER — Other Ambulatory Visit: Payer: Self-pay | Admitting: Obstetrics and Gynecology

## 2018-06-14 DIAGNOSIS — Z1231 Encounter for screening mammogram for malignant neoplasm of breast: Secondary | ICD-10-CM

## 2018-07-09 ENCOUNTER — Ambulatory Visit
Admission: RE | Admit: 2018-07-09 | Discharge: 2018-07-09 | Disposition: A | Discharge status: Home / Self Care | Payer: PRIVATE HEALTH INSURANCE | Source: Ambulatory Visit | Attending: Obstetrics and Gynecology | Admitting: Obstetrics and Gynecology

## 2018-07-09 ENCOUNTER — Other Ambulatory Visit: Payer: Self-pay

## 2018-07-09 DIAGNOSIS — Z1231 Encounter for screening mammogram for malignant neoplasm of breast: Secondary | ICD-10-CM

## 2018-07-11 ENCOUNTER — Other Ambulatory Visit: Payer: Self-pay | Admitting: Obstetrics and Gynecology

## 2018-07-11 DIAGNOSIS — R928 Other abnormal and inconclusive findings on diagnostic imaging of breast: Secondary | ICD-10-CM

## 2018-07-16 ENCOUNTER — Ambulatory Visit
Admission: RE | Admit: 2018-07-16 | Discharge: 2018-07-16 | Disposition: A | Discharge status: Home / Self Care | Payer: PRIVATE HEALTH INSURANCE | Source: Ambulatory Visit | Attending: Obstetrics and Gynecology | Admitting: Obstetrics and Gynecology

## 2018-07-16 ENCOUNTER — Other Ambulatory Visit: Payer: Self-pay

## 2018-07-16 DIAGNOSIS — R928 Other abnormal and inconclusive findings on diagnostic imaging of breast: Secondary | ICD-10-CM

## 2018-08-07 ENCOUNTER — Other Ambulatory Visit: Payer: Self-pay | Admitting: Neurology

## 2018-08-07 MED ORDER — LORAZEPAM 0.5 MG PO TABS: 0.5000 mg | ORAL_TABLET | Freq: Three times a day (TID) | ORAL | 0 refills | Status: DC | PRN

## 2019-01-13 ENCOUNTER — Other Ambulatory Visit: Payer: Self-pay | Admitting: Neurology

## 2019-01-13 ENCOUNTER — Other Ambulatory Visit (INDEPENDENT_AMBULATORY_CARE_PROVIDER_SITE_OTHER): Payer: Self-pay

## 2019-01-13 DIAGNOSIS — Z0289 Encounter for other administrative examinations: Secondary | ICD-10-CM

## 2019-01-13 DIAGNOSIS — Z1152 Encounter for screening for COVID-19: Secondary | ICD-10-CM

## 2019-01-14 LAB — SAR COV2 SEROLOGY (COVID19)AB(IGG),IA: DiaSorin SARS-CoV-2 Ab, IgG: NEGATIVE

## 2019-01-26 ENCOUNTER — Other Ambulatory Visit: Payer: Self-pay | Admitting: Neurology

## 2019-01-26 MED ORDER — FLUOXETINE HCL 40 MG PO CAPS: 40.0000 mg | ORAL_CAPSULE | Freq: Every day | ORAL | 4 refills | Status: AC

## 2019-01-26 MED ORDER — BUPROPION HCL ER (XL) 300 MG PO TB24: ORAL_TABLET | ORAL | 4 refills | Status: AC

## 2019-01-26 MED ORDER — LORAZEPAM 0.5 MG PO TABS: 0.5000 mg | ORAL_TABLET | Freq: Three times a day (TID) | ORAL | 1 refills | Status: AC | PRN

## 2019-03-22 ENCOUNTER — Ambulatory Visit: Payer: Self-pay | Attending: Internal Medicine

## 2019-03-22 DIAGNOSIS — Z23 Encounter for immunization: Secondary | ICD-10-CM

## 2019-03-22 NOTE — Progress Notes (Signed)
   Covid-19 Vaccination Clinic  Name:  Kelly Rice    MRN: [id number] DOB: 09-11-1965  03/22/2019  Kelly Rice was observed post Covid-19 immunization for 15 minutes without incident. She was provided with Vaccine Information Sheet and instruction to access the V-Safe system.   Kelly Rice was instructed to call 911 with any severe reactions post vaccine: Marland Kitchen Difficulty breathing  . Swelling of face and throat  . A fast heartbeat  . A bad rash all over body  . Dizziness and weakness   Immunizations Administered    Name Date Dose VIS Date Route   Moderna COVID-19 Vaccine 03/22/2019 11:39 AM 0.5 mL 12/03/2018 Intramuscular   Manufacturer: Moderna   Lot: [id number]   [place]:[id number]

## 2019-04-23 ENCOUNTER — Ambulatory Visit: Payer: Self-pay | Attending: Internal Medicine

## 2019-04-23 DIAGNOSIS — Z23 Encounter for immunization: Secondary | ICD-10-CM

## 2019-04-23 NOTE — Progress Notes (Signed)
   Covid-19 Vaccination Clinic  Name:  Lyndsey Lichtman    MRN: [id number] DOB: 18-Jul-1965  04/23/2019  Ms. Deshaies was observed post Covid-19 immunization for 15 minutes without incident. She was provided with Vaccine Information Sheet and instruction to access the V-Safe system.   Ms. Pietropaolo was instructed to call 911 with any severe reactions post vaccine: Marland Kitchen Difficulty breathing  . Swelling of face and throat  . A fast heartbeat  . A bad rash all over body  . Dizziness and weakness   Immunizations Administered    Name Date Dose VIS Date Route   Moderna COVID-19 Vaccine 04/23/2019 12:08 PM 0.5 mL 12/2018 Intramuscular   Manufacturer: Moderna   Lot: [id number]   [place]:[id number]

## 2019-05-26 ENCOUNTER — Other Ambulatory Visit: Payer: Self-pay | Admitting: Chiropractic Medicine

## 2019-05-26 DIAGNOSIS — M25561 Pain in right knee: Secondary | ICD-10-CM

## 2019-05-26 DIAGNOSIS — M25661 Stiffness of right knee, not elsewhere classified: Secondary | ICD-10-CM

## 2019-06-03 ENCOUNTER — Other Ambulatory Visit: Payer: Self-pay

## 2019-06-03 ENCOUNTER — Ambulatory Visit
Admission: RE | Admit: 2019-06-03 | Discharge: 2019-06-03 | Disposition: A | Discharge status: Home / Self Care | Payer: Managed Care, Other (non HMO) | Source: Ambulatory Visit | Attending: Chiropractic Medicine | Admitting: Chiropractic Medicine

## 2019-06-03 DIAGNOSIS — M25661 Stiffness of right knee, not elsewhere classified: Secondary | ICD-10-CM

## 2019-06-03 DIAGNOSIS — M25561 Pain in right knee: Secondary | ICD-10-CM

## 2019-07-01 ENCOUNTER — Other Ambulatory Visit: Payer: Self-pay

## 2019-08-22 ENCOUNTER — Other Ambulatory Visit: Payer: Self-pay | Admitting: Obstetrics and Gynecology

## 2019-08-22 DIAGNOSIS — Z1231 Encounter for screening mammogram for malignant neoplasm of breast: Secondary | ICD-10-CM

## 2019-08-25 ENCOUNTER — Ambulatory Visit
Admission: RE | Admit: 2019-08-25 | Discharge: 2019-08-25 | Disposition: A | Discharge status: Home / Self Care | Payer: Managed Care, Other (non HMO) | Source: Ambulatory Visit

## 2019-08-25 ENCOUNTER — Other Ambulatory Visit: Payer: Self-pay

## 2019-08-25 DIAGNOSIS — Z1231 Encounter for screening mammogram for malignant neoplasm of breast: Secondary | ICD-10-CM

## 2019-08-27 ENCOUNTER — Other Ambulatory Visit: Payer: Self-pay | Admitting: Obstetrics and Gynecology

## 2019-08-27 DIAGNOSIS — R928 Other abnormal and inconclusive findings on diagnostic imaging of breast: Secondary | ICD-10-CM

## 2019-09-01 ENCOUNTER — Other Ambulatory Visit: Payer: Self-pay

## 2019-09-01 ENCOUNTER — Ambulatory Visit
Admission: RE | Admit: 2019-09-01 | Discharge: 2019-09-01 | Disposition: A | Discharge status: Home / Self Care | Payer: Managed Care, Other (non HMO) | Source: Ambulatory Visit | Attending: Obstetrics and Gynecology | Admitting: Obstetrics and Gynecology

## 2019-09-01 DIAGNOSIS — R928 Other abnormal and inconclusive findings on diagnostic imaging of breast: Secondary | ICD-10-CM

## 2020-01-23 ENCOUNTER — Other Ambulatory Visit: Payer: Self-pay | Admitting: Orthopedic Surgery

## 2020-01-23 DIAGNOSIS — M25511 Pain in right shoulder: Secondary | ICD-10-CM

## 2020-01-28 ENCOUNTER — Ambulatory Visit
Admission: RE | Admit: 2020-01-28 | Discharge: 2020-01-28 | Disposition: A | Discharge status: Home / Self Care | Payer: Managed Care, Other (non HMO) | Source: Ambulatory Visit | Attending: Orthopedic Surgery | Admitting: Orthopedic Surgery

## 2020-01-28 DIAGNOSIS — M25511 Pain in right shoulder: Secondary | ICD-10-CM

## 2020-02-05 ENCOUNTER — Other Ambulatory Visit: Payer: Self-pay

## 2020-02-05 ENCOUNTER — Ambulatory Visit (AMBULATORY_SURGERY_CENTER): Payer: Self-pay | Admitting: *Deleted

## 2020-02-05 VITALS — Ht 64.0 in | Wt 151.0 lb

## 2020-02-05 DIAGNOSIS — Z1211 Encounter for screening for malignant neoplasm of colon: Secondary | ICD-10-CM

## 2020-02-05 MED ORDER — SUTAB 1479-225-188 MG PO TABS: 24.0000 | ORAL_TABLET | ORAL | 0 refills | Status: DC

## 2020-02-05 NOTE — Progress Notes (Signed)
No egg or soy allergy known to patient  No issues with past sedation with any surgeries or procedures No intubation problems in the past  No FH of Malignant Hyperthermia No diet pills per patient No home 02 use per patient  No blood thinners per patient  Pt denies issues with constipation  No A fib or A flutter  EMMI video to pt or via MyChart  COVID 19 guidelines implemented in PV today with Pt and RN  Pt is fully vaccinated  for Covid   Sutab  Coupon given to pt in PV today , Code to Pharmacy and  NO PA's for preps discussed with pt in PV today   Due to the COVID-19 pandemic we are asking patients to follow certain guidelines.  Pt aware of COVID protocols and [facility] guidelines   

## 2020-02-17 ENCOUNTER — Encounter: Payer: Self-pay | Admitting: Gastroenterology

## 2020-02-19 ENCOUNTER — Encounter: Payer: Managed Care, Other (non HMO) | Admitting: Gastroenterology

## 2020-02-25 ENCOUNTER — Other Ambulatory Visit: Payer: Self-pay

## 2020-02-25 ENCOUNTER — Ambulatory Visit: Payer: Managed Care, Other (non HMO) | Admitting: Internal Medicine

## 2020-02-25 ENCOUNTER — Encounter: Payer: Self-pay | Admitting: Internal Medicine

## 2020-02-25 VITALS — BP 119/79 | HR 80 | Temp 97.4°F | Ht 64.0 in | Wt 152.0 lb

## 2020-02-25 DIAGNOSIS — L03211 Cellulitis of face: Secondary | ICD-10-CM | POA: Diagnosis not present

## 2020-02-25 DIAGNOSIS — A319 Mycobacterial infection, unspecified: Secondary | ICD-10-CM

## 2020-02-25 NOTE — Patient Instructions (Signed)
We received updated culture on 2/22 that says mycobacterial species, not tb or MAC, but still pending final identification and susceptibility testing  I agree with susupicion potentially there is involvement of this bacteria in your eyelid  Given the complexity of identification/treatment options, and rather slow progression (if it progresses at all), we would rather wait for identification prior to starting antibiotics.  Please take weekly picutures of the lesion and send to [facility]  See your plastic surgeon as scheduled. If there developes an abscess, that should be debrided. But if it looks stable like it is today we can leave it alone   I will see you on march 14th

## 2020-02-25 NOTE — Progress Notes (Signed)
[facility] for Infectious Disease  Reason for Consult:eye lid abscess; afb in cx  Referring Provider: Sarajane Marek MD     Patient Active Problem List   Diagnosis Date Noted  . Left knee pain 06/21/2016  . Hand pain, right first web space 05/11/2014  . Acute meniscal tear of knee 02/14/2011  . Metatarsalgia of both feet 04/21/2009  . ABNORMALITY OF GAIT 04/21/2009      HPI: Kelly Rice is a 55 y.o. female otherwise healthy referred by her oculofacial plastic surgeon for eye lid abscess and debridement cx showing AFB  12/11 bilateral eye lid surgery for cosmetic reason. There was pain/infectious issue before that. 10 days later the suture was removed. Continue to do well. 30 days after surgery started to notice bumps along incision line right side. No pain/discharge/redness. Doesn't bother her in anyway 4 weeks prior to this clinic visit, she had the incision opened up. She was told there was some fluid there to be cultured.   She was given cephalexin a week prior to the incision being opened up. "it didn't do anything, so I stopped," after a week. 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 Arthritis   . Depression     Social History   Tobacco Use  . Smoking status: Never Smoker  . Smokeless tobacco: Never Used  Substance Use Topics  . Alcohol use: Yes    Family  History  Problem Relation Age of Onset  . Colon cancer Neg Hx   . Colon polyps Neg Hx   . Esophageal cancer Neg Hx   . Rectal cancer Neg Hx   . Stomach cancer Neg Hx     Allergies  Allergen Reactions  . Codeine Nausea Only  . Penicillins Rash    OBJECTIVE: There were no vitals filed for this visit. There is no height or weight on file to calculate BMI.   Physical Exam No distress, pleasant Skin: bilateral upper eye lid incision all healed; the right eye lid there is a small 3 mm lateral erythematous papule; otherwise no fluctuance/tenderness Heent; atraumatic; per; conj clear; eomi Neck supple cv rrr no mrg Lungs clear abd s/nt Ext no edema msk no synovitis Neuro cn2-12 intact. nonfocal Psych alert/oriented  Lab:  Microbiology: 2/02 osh micro -- afb stain negative; cx AFB species negative mac/TB complex probe  Serology:  Imaging:   Assessment/plan: Problem List Items Addressed This Visit   None   Visit Diagnoses    Nontuberculous mycobacterial infection    -  Primary   Cellulitis of face            Per report the growth was noticed 10 days from the collection date. Could still be ntm which is more common with these procedures. It is in a sensitive area but appears rather nonaggressive at this time. Will wait on culture/susceptibility prior to starting  abx for her  -weekly pictures to be sent via mychart -I will see her in 3/14 (or sooner if lesion worse more rapidly) -will f/u culture at that time as well  Dr rubinstein's office [phone]      Follow-up: Return in 19 days (on 03/15/2020).    I spent 60 minute reviewing data/chart, and coordinating care and >50% direct face to face time providing counseling/discussing diagnostics/treatment plan with patient   Jabier Mutton, [facility] [place] [phone] pager   [phone] cell 02/25/2020, 9:07 AM

## 2020-03-09 NOTE — Telephone Encounter (Signed)
Left voicemail asking for updated labs from Dr Rubenstein's office.  Asked them to call back at [phone] or to fax any update to [phone]. Notified patient that we have asked for lab update from the referring provider. Landis Gandy, RN

## 2020-03-10 NOTE — Telephone Encounter (Signed)
I have called Dr. Susa Griffins office and was not able to get anyone on the phone. I have left a message for them to fax patient's results to our office.

## 2020-03-15 ENCOUNTER — Ambulatory Visit: Payer: Managed Care, Other (non HMO) | Admitting: Internal Medicine

## 2020-04-05 ENCOUNTER — Encounter: Payer: Self-pay | Admitting: Gastroenterology

## 2020-04-05 ENCOUNTER — Ambulatory Visit (AMBULATORY_SURGERY_CENTER): Payer: Managed Care, Other (non HMO) | Admitting: Gastroenterology

## 2020-04-05 ENCOUNTER — Other Ambulatory Visit: Payer: Self-pay

## 2020-04-05 VITALS — BP 120/76 | HR 55 | Temp 97.7°F | Resp 15 | Ht 64.0 in | Wt 151.0 lb

## 2020-04-05 DIAGNOSIS — K6389 Other specified diseases of intestine: Secondary | ICD-10-CM | POA: Diagnosis not present

## 2020-04-05 DIAGNOSIS — D123 Benign neoplasm of transverse colon: Secondary | ICD-10-CM | POA: Diagnosis not present

## 2020-04-05 DIAGNOSIS — D120 Benign neoplasm of cecum: Secondary | ICD-10-CM

## 2020-04-05 DIAGNOSIS — K648 Other hemorrhoids: Secondary | ICD-10-CM

## 2020-04-05 DIAGNOSIS — R195 Other fecal abnormalities: Secondary | ICD-10-CM

## 2020-04-05 MED ORDER — SODIUM CHLORIDE 0.9 % IV SOLN: 500.0000 mL | Freq: Once | INTRAVENOUS | Status: DC

## 2020-04-05 NOTE — Progress Notes (Signed)
No problems noted in the recovery room. maw 

## 2020-04-05 NOTE — Progress Notes (Signed)
Called to room to assist during endoscopic procedure.  Patient ID and intended procedure confirmed with present staff. Received instructions for my participation in the procedure from the performing physician.  

## 2020-04-05 NOTE — Patient Instructions (Addendum)
Handouts were given to your care partner on polyps and hemorrhoids. You may resume your current medications today. Await biopsy results.  May take 1-3 weeks to receive pathology results. Please call if any questions or concerns.     YOU HAD AN ENDOSCOPIC PROCEDURE TODAY AT [facility]:   Refer to the procedure report that was given to you for any specific questions about what was found during the examination.  If the procedure report does not answer your questions, please call your gastroenterologist to clarify.  If you requested that your care partner not be given the details of your procedure findings, then the procedure report has been included in a sealed envelope for you to review at your convenience later.  YOU SHOULD EXPECT: Some feelings of bloating in the abdomen. Passage of more gas than usual.  Walking can help get rid of the air that was put into your GI tract during the procedure and reduce the bloating. If you had a lower endoscopy (such as a colonoscopy or flexible sigmoidoscopy) you may notice spotting of blood in your stool or on the toilet paper. If you underwent a bowel prep for your procedure, you may not have a normal bowel movement for a few days.  Please Note:  You might notice some irritation and congestion in your nose or some drainage.  This is from the oxygen used during your procedure.  There is no need for concern and it should clear up in a day or so.  SYMPTOMS TO REPORT IMMEDIATELY:   Following lower endoscopy (colonoscopy or flexible sigmoidoscopy):  Excessive amounts of blood in the stool  Significant tenderness or worsening of abdominal pains  Swelling of the abdomen that is new, acute  Fever of 100F or higher   For urgent or emergent issues, a gastroenterologist can be reached at any hour by calling [phone]. Do not use MyChart messaging for urgent concerns.    DIET:  We do recommend a small meal at first, but then you may proceed  to your regular diet.  Drink plenty of fluids but you should avoid alcoholic beverages for 24 hours.  ACTIVITY:  You should plan to take it easy for the rest of today and you should NOT DRIVE or use heavy machinery until tomorrow (because of the sedation medicines used during the test).    FOLLOW UP: Our staff will call the number listed on your records 48-72 hours following your procedure to check on you and address any questions or concerns that you may have regarding the information given to you following your procedure. If we do not reach you, we will leave a message.  We will attempt to reach you two times.  During this call, we will ask if you have developed any symptoms of COVID 19. If you develop any symptoms (ie: fever, flu-like symptoms, shortness of breath, cough etc.) before then, please call [phone].  If you test positive for Covid 19 in the 2 weeks post procedure, please call and report this information to [place].    If any biopsies were taken you will be contacted by phone or by letter within the next 1-3 weeks.  Please call us at [phone] if you have not heard about the biopsies in 3 weeks.    SIGNATURES/CONFIDENTIALITY: You and/or your care partner have signed paperwork which will be entered into your electronic medical record.  These signatures attest to the fact that that the information above on your After Visit  Summary has been reviewed and is understood.  Full responsibility of the confidentiality of this discharge information lies with you and/or your care-partner.

## 2020-04-05 NOTE — Progress Notes (Signed)
A/ox3, pleased with MAC, report to RN 

## 2020-04-05 NOTE — Progress Notes (Signed)
VS by   Pt's states no medical or surgical changes since previsit or office visit.  

## 2020-04-05 NOTE — Op Note (Signed)
[facility] Patient Name: Kelly Rice Procedure Date: 04/05/2020 2:03 PM MRN: [id number] Endoscopist: Mauri Pole , MD Age: 55 Referring MD:  Date of Birth: [birth year] Gender: Female Account #: [id number] Procedure:                Colonoscopy Indications:              Positive Cologuard test Medicines:                Monitored Anesthesia Care Procedure:                Pre-Anesthesia Assessment:                           - Prior to the procedure, a History and Physical                            was performed, and patient medications and                            allergies were reviewed. The patient's tolerance of                            previous anesthesia was also reviewed. The risks                            and benefits of the procedure and the sedation                            options and risks were discussed with the patient.                            All questions were answered, and informed consent                            was obtained. Prior Anticoagulants: The patient has                            taken no previous anticoagulant or antiplatelet                            agents. ASA Grade Assessment: II - A patient with                            mild systemic disease. After reviewing the risks                            and benefits, the patient was deemed in                            satisfactory condition to undergo the procedure.                           After obtaining informed consent, the colonoscope  was passed under direct vision. Throughout the                            procedure, the patient's blood pressure, pulse, and                            oxygen saturations were monitored continuously. The                            Olympus PFC-H190DL [id number]) Colonoscope was                            introduced through the anus and advanced to the the                            cecum, identified by appendiceal  orifice and                            ileocecal valve. The colonoscopy was performed                            without difficulty. The patient tolerated the                            procedure well. The quality of the bowel                            preparation was adequate. The ileocecal valve,                            appendiceal orifice, and rectum were photographed. Scope In: 2:15:30 PM Scope Out: 2:34:05 PM Scope Withdrawal Time: 0 hours 11 minutes 5 seconds  Total Procedure Duration: 0 hours 18 minutes 35 seconds  Findings:                 The perianal and digital rectal examinations were                            normal.                           A 8 mm polyp was found in the cecum. The polyp was                            sessile. The polyp was removed with a cold snare.                            Resection and retrieval were complete.                           A 2 mm polyp was found in the transverse colon. The                            polyp was sessile. The polyp was removed with  a                            cold biopsy forceps. Resection and retrieval were                            complete.                           A diffuse area of severe melanosis was found in the                            entire colon.                           Non-bleeding external and internal hemorrhoids were                            found during retroflexion. The hemorrhoids were                            medium-sized. Complications:            No immediate complications. Estimated Blood Loss:     Estimated blood loss was minimal. Impression:               - One 8 mm polyp in the cecum, removed with a cold                            snare. Resected and retrieved.                           - One 2 mm polyp in the transverse colon, removed                            with a cold biopsy forceps. Resected and retrieved.                           - Melanosis in the colon.                            - Non-bleeding external and internal hemorrhoids. Recommendation:           - Patient has a contact number available for                            emergencies. The signs and symptoms of potential                            delayed complications were discussed with the                            patient. Return to normal activities tomorrow.                            Written discharge instructions were provided to the  patient.                           - Resume previous diet.                           - Continue present medications.                           - Await pathology results.                           - Repeat colonoscopy in 5-10 years for surveillance                            based on pathology results. Mauri Pole, MD 04/05/2020 2:43:27 PM This report has been signed electronically.

## 2020-04-07 ENCOUNTER — Telehealth: Payer: Self-pay

## 2020-04-07 NOTE — Telephone Encounter (Signed)
  Follow up Call-  Call back number 04/05/2020  Post procedure Call Back phone  # [phone]  Permission to leave phone message Yes  Some recent data might be hidden     Patient questions:  Do you have a fever, pain , or abdominal swelling? No. Pain Score  0 *  Have you tolerated food without any problems? Yes.    Have you been able to return to your normal activities? Yes.    Do you have any questions about your discharge instructions: Diet   No. Medications  No. Follow up visit  No.  Do you have questions or concerns about your Care? No.  Actions: * If pain score is 4 or above: No action needed, pain <4. 1. Have you developed a fever since your procedure? no  2.   Have you had an respiratory symptoms (SOB or cough) since your procedure? no  3.   Have you tested positive for COVID 19 since your procedure no  4.   Have you had any family members/close contacts diagnosed with the COVID 19 since your procedure?  no   If yes to any of these questions please route to Joylene John, RN and Joella Prince, RN

## 2020-04-07 NOTE — Telephone Encounter (Signed)
Left message on follow up call. 

## 2020-04-27 ENCOUNTER — Encounter: Payer: Self-pay | Admitting: Gastroenterology

## 2020-04-27 ENCOUNTER — Other Ambulatory Visit: Payer: Self-pay | Admitting: Neurology

## 2020-04-27 MED ORDER — PROPRANOLOL HCL 10 MG PO TABS: 10.0000 mg | ORAL_TABLET | Freq: Three times a day (TID) | ORAL | 3 refills | Status: AC

## 2020-04-28 ENCOUNTER — Other Ambulatory Visit: Payer: Self-pay | Admitting: Neurology

## 2020-04-28 MED ORDER — AZITHROMYCIN 250 MG PO TABS: ORAL_TABLET | ORAL | 0 refills | Status: DC

## 2020-05-19 ENCOUNTER — Telehealth: Payer: Self-pay

## 2020-05-19 NOTE — Telephone Encounter (Signed)
Left patient voice mail to call back to schedule an appointment with DR. Vu after a message we received requesting an appointment. Patient left Comment : Right Eyelid

## 2020-05-20 NOTE — Telephone Encounter (Signed)
Received call from Dr. Leonard Schwartz requesting call back from Dr. Gale Journey. Message did not specify reason for call  Number is [place], RN

## 2020-06-04 ENCOUNTER — Other Ambulatory Visit: Payer: Self-pay | Admitting: Neurology

## 2020-06-04 MED ORDER — AZITHROMYCIN 250 MG PO TABS: ORAL_TABLET | ORAL | 0 refills | Status: AC

## 2020-06-16 ENCOUNTER — Ambulatory Visit: Payer: Managed Care, Other (non HMO) | Admitting: Internal Medicine

## 2020-09-07 ENCOUNTER — Other Ambulatory Visit: Payer: Self-pay | Admitting: Physician Assistant

## 2020-09-07 DIAGNOSIS — Z1231 Encounter for screening mammogram for malignant neoplasm of breast: Secondary | ICD-10-CM

## 2020-09-09 DIAGNOSIS — Z1231 Encounter for screening mammogram for malignant neoplasm of breast: Secondary | ICD-10-CM

## 2020-09-14 ENCOUNTER — Other Ambulatory Visit: Payer: Self-pay

## 2020-09-14 ENCOUNTER — Ambulatory Visit: Payer: Managed Care, Other (non HMO) | Admitting: Sports Medicine

## 2020-09-14 VITALS — Ht 64.0 in | Wt 140.0 lb

## 2020-09-14 DIAGNOSIS — R269 Unspecified abnormalities of gait and mobility: Secondary | ICD-10-CM | POA: Diagnosis not present

## 2020-09-15 NOTE — Progress Notes (Signed)
Patient ID: Kelly Rice, female   DOB: 1965/06/15, 55 y.o.   MRN: [id number]  Patient presents today for new custom orthotics.  Her current orthotics are quite old.  She denies any current complaints.  Orthotics were constructed as below.  Gait was neutral with orthotics in place.  She will follow-up as needed.  Patient was fitted for a : standard, cushioned, semi-rigid orthotic. The orthotic was heated and afterward the patient stood on the orthotic blank positioned on the orthotic stand. The patient was positioned in subtalar neutral position and 10 degrees of ankle dorsiflexion in a weight bearing stance. After completion of molding, a stable base was applied to the orthotic blank. The blank was ground to a stable position for weight bearing. Size: 8 Base: Blue EVA Posting: Bilateral first ray posts Additional orthotic padding: Bilateral metatarsal pads

## 2020-09-17 ENCOUNTER — Other Ambulatory Visit: Payer: Self-pay

## 2020-09-17 ENCOUNTER — Ambulatory Visit
Admission: RE | Admit: 2020-09-17 | Discharge: 2020-09-17 | Disposition: A | Discharge status: Home / Self Care | Payer: Managed Care, Other (non HMO) | Source: Ambulatory Visit | Attending: Physician Assistant | Admitting: Physician Assistant

## 2020-09-17 DIAGNOSIS — Z1231 Encounter for screening mammogram for malignant neoplasm of breast: Secondary | ICD-10-CM

## 2020-09-30 ENCOUNTER — Other Ambulatory Visit: Payer: Self-pay | Admitting: Neurology

## 2020-10-10 ENCOUNTER — Other Ambulatory Visit: Payer: Self-pay | Admitting: Neurology

## 2020-10-10 ENCOUNTER — Telehealth: Payer: Self-pay | Admitting: Neurology

## 2020-10-10 MED ORDER — MOUNJARO 5 MG/0.5ML ~~LOC~~ SOAJ: 5.0000 mg | SUBCUTANEOUS | 2 refills | Status: DC

## 2020-10-10 NOTE — Telephone Encounter (Signed)
Kelly Rice is her primary care at [facility] primary care on [address]. 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 In the meantime has failed trulicity samples (side effects) and metformin (side effects) will try mounjaro elevated glucose, insulin resistance and weight game. Will schedule appointment with [facility] in the next 2 weeks. Will try new mounjaro.

## 2020-10-20 ENCOUNTER — Other Ambulatory Visit: Payer: Self-pay | Admitting: Neurology

## 2020-10-20 MED ORDER — ONDANSETRON 4 MG PO TBDP: 4.0000 mg | ORAL_TABLET | Freq: Four times a day (QID) | ORAL | 3 refills | Status: AC | PRN

## 2020-10-22 ENCOUNTER — Other Ambulatory Visit: Payer: Self-pay | Admitting: Neurology

## 2020-10-22 DIAGNOSIS — E119 Type 2 diabetes mellitus without complications: Secondary | ICD-10-CM

## 2020-10-22 MED ORDER — MOUNJARO 2.5 MG/0.5ML ~~LOC~~ SOAJ
2.5000 mg | SUBCUTANEOUS | 1 refills | Status: DC
Start: 2020-10-22 — End: 2021-01-12

## 2020-12-20 ENCOUNTER — Ambulatory Visit (INDEPENDENT_AMBULATORY_CARE_PROVIDER_SITE_OTHER): Payer: Self-pay | Admitting: Plastic Surgery

## 2020-12-20 ENCOUNTER — Other Ambulatory Visit: Payer: Self-pay

## 2020-12-20 VITALS — BP 118/75 | HR 75 | Ht 64.0 in | Wt 134.0 lb

## 2020-12-20 DIAGNOSIS — Z411 Encounter for cosmetic surgery: Secondary | ICD-10-CM

## 2020-12-21 NOTE — Progress Notes (Signed)
Referring Provider [address], [address] [place],  [place] [id number]   CC:  Neck aesthetics   Kelly Rice is an 55 y.o. female.  HPI: 55 year old interested in improvement of neck aesthetics.  She notes that she does not have a sharp jaw line and notes that she feels like this runs in the family.  She is less satisfied with her neck as she has gotten older.  She does not use tobacco.  She has not had other cosmetic procedrues.  Allergies  Allergen Reactions   Codeine Nausea Only   Penicillins Rash    Outpatient Encounter Medications as of 12/20/2020  Medication Sig   azithromycin (ZITHROMAX Z-PAK) 250 MG tablet 500mg (2 tabs) PO on day one. Then 250mg (1 tab) daily for 4 additional days.   buPROPion (WELLBUTRIN XL) 300 MG 24 hr tablet TAKE 1 TABLET(300 MG) BY MOUTH DAILY   FLUoxetine (PROZAC) 40 MG capsule Take 1 capsule (40 mg total) by mouth daily.   ibuprofen (ADVIL,MOTRIN) 800 MG tablet TAKE 1 TABLET BY MOUTH EVERY 8 HOURS AS NEEDED   LORazepam (ATIVAN) 0.5 MG tablet Take 1 tablet (0.5 mg total) by mouth every 8 (eight) hours as needed for anxiety.   ondansetron (ZOFRAN-ODT) 4 MG disintegrating tablet Take 1 tablet (4 mg total) by mouth every 6 (six) hours as needed for nausea.   propranolol (INDERAL) 10 MG tablet Take 1 tablet (10 mg total) by mouth 3 (three) times daily.   tirzepatide [facility]) 2.5 MG/0.5ML Pen Inject 2.5 mg into the skin once a week.   No facility-administered encounter medications on file as of 12/20/2020.     Past Medical History:  Diagnosis Date   Arthritis    Depression     Past Surgical History:  Procedure Laterality Date   BREAST BIOPSY Left 04/07/2015   benign   BUNIONECTOMY     BIL   KNEE ARTHROSCOPY  03/08/2011   Procedure: ARTHROSCOPY KNEE;  Surgeon: Alta Corning, MD;  Location: [facility];  Service: Orthopedics;  Laterality: Right;  Partial Medial Meniscectomy, Chondroplasty Patella,  Medial Femoral Condyle Chondroplasty   KNEE ARTHROSCOPY Left 2017   TUBAL LIGATION      Family History  Problem Relation Age of Onset   Colon cancer Neg Hx    Colon polyps Neg Hx    Esophageal cancer Neg Hx    Rectal cancer Neg Hx    Stomach cancer Neg Hx     SH:  no tobacco, no illicit drugs   Review of Systems General: Denies fevers, chills, weight loss CV: Denies chest pain, shortness of breath, palpitations   Physical Exam Vitals with BMI 12/20/2020 09/14/2020 04/05/2020  Height 5\' 4"  5\' 4"  -  Weight 134 lbs 140 lbs -  BMI 14.48 18.56 -  Systolic 314 - 970  Diastolic 75 - 76  Pulse 75 - 55    General:  No acute distress,  Alert and oriented, Non-Toxic, Normal speech and affect Heent:   Neck with some excess skin and some platysmal banding, obtuse cervicomental angle, moderate neck adipose, some fascial rhytids  Assessment/Plan The patient is a good candidate for lower face and neck lift with some neck liposuction.  She is aware that she will get significant improvement but we will be unable to give her a completely sharp cervicomental angle given her underlying anatomy.  She is also a candidate for injectables, laser resurfacing, and other cosmetic options.  Lennice Sites 12/21/2020, 3:49 PM

## 2021-01-02 ENCOUNTER — Other Ambulatory Visit: Payer: Self-pay | Admitting: Neurology

## 2021-01-12 ENCOUNTER — Telehealth (INDEPENDENT_AMBULATORY_CARE_PROVIDER_SITE_OTHER): Payer: Self-pay | Admitting: Neurology

## 2021-01-12 ENCOUNTER — Encounter: Payer: Self-pay | Admitting: Neurology

## 2021-01-12 ENCOUNTER — Telehealth: Payer: Self-pay | Admitting: *Deleted

## 2021-01-12 ENCOUNTER — Other Ambulatory Visit (HOSPITAL_COMMUNITY): Payer: Self-pay

## 2021-01-12 DIAGNOSIS — E119 Type 2 diabetes mellitus without complications: Secondary | ICD-10-CM | POA: Insufficient documentation

## 2021-01-12 DIAGNOSIS — E1169 Type 2 diabetes mellitus with other specified complication: Secondary | ICD-10-CM | POA: Diagnosis not present

## 2021-01-12 DIAGNOSIS — G932 Benign intracranial hypertension: Secondary | ICD-10-CM | POA: Diagnosis not present

## 2021-01-12 DIAGNOSIS — R519 Headache, unspecified: Secondary | ICD-10-CM

## 2021-01-12 MED ORDER — MOUNJARO 5 MG/0.5ML ~~LOC~~ SOAJ
5.0000 mg | SUBCUTANEOUS | 6 refills | Status: DC
  Filled 2021-01-12 – 2021-01-13 (×2): qty 2, 28d supply, fill #0
  Filled 2021-02-14: qty 2, 28d supply, fill #1

## 2021-01-12 NOTE — Telephone Encounter (Signed)
Kelly Rice Kelly Rice - PA Case ID: [id number]  PA for [facility] was sent this afternoon . Waiting on approval

## 2021-01-12 NOTE — Patient Instructions (Signed)
Tirzepatide Injection What is this medication? TIRZEPATIDE (tir ZEP a tide) treats type 2 diabetes. It works by increasing insulin levels in your body, which decreases your blood sugar (glucose). Changes to diet and exercise are often combined with this medication. This medicine may be used for other purposes; ask your health care provider or pharmacist if you have questions. COMMON BRAND NAME(S): MOUNJARO What should I tell my care team before I take this medication? They need to know if you have any of these conditions: Endocrine tumors (MEN 2) or if someone in your family had these tumors Eye disease, vision problems Gallbladder disease History of pancreatitis Kidney disease Stomach or intestine problems Thyroid cancer or if someone in your family had thyroid cancer An unusual or allergic reaction to tirzepatide, other medications, foods, dyes, or preservatives Pregnant or trying to get pregnant Breast-feeding How should I use this medication? This medication is injected under the skin. You will be taught how to prepare and give it. It is given once every week (every 7 days). Keep taking it unless your health care provider tells you to stop. If you use this medication with insulin, you should inject this medication and the insulin separately. Do not mix them together. Do not give the injections right next to each other. Change (rotate) injection sites with each injection. This medication comes with INSTRUCTIONS FOR USE. Ask your pharmacist for directions on how to use this medication. Read the information carefully. Talk to your pharmacist or care team if you have questions. It is important that you put your used needles and syringes in a special sharps container. Do not put them in a trash can. If you do not have a sharps container, call your pharmacist or care team to get one. A special MedGuide will be given to you by the pharmacist with each prescription and refill. Be sure to read this  information carefully each time. Talk to your care team about the use of this medication in children. Special care may be needed. Overdosage: If you think you have taken too much of this medicine contact a poison control center or emergency room at once. NOTE: This medicine is only for you. Do not share this medicine with others. What if I miss a dose? If you miss a dose, take it as soon as you can unless it is more than 4 days (96 hours) late. If it is more than 4 days late, skip the missed dose. Take the next dose at the normal time. Do not take 2 doses within 3 days of each other. What may interact with this medication? Alcohol containing beverages Antiviral medications for HIV or AIDS Aspirin and aspirin-like medications Beta-blockers like atenolol, metoprolol, propranolol Certain medications for blood pressure, heart disease, irregular heart beat Chromium Clonidine Diuretics Female hormones, such as estrogens or progestins, birth control pills Fenofibrate Gemfibrozil Guanethidine Isoniazid Lanreotide Female hormones or anabolic steroids MAOIs like Carbex, Eldepryl, Marplan, Nardil, and Parnate Medications for weight loss Medications for allergies, asthma, cold, or cough Medications for depression, anxiety, or psychotic disturbances Niacin Nicotine NSAIDs, medications for pain and inflammation, like ibuprofen or naproxen Octreotide Other medications for diabetes, like glyburide, glipizide, or glimepiride Pasireotide Pentamidine Phenytoin Probenecid Quinolone antibiotics such as ciprofloxacin, levofloxacin, ofloxacin Reserpine Some herbal dietary supplements Steroid medications such as prednisone or cortisone Sulfamethoxazole; trimethoprim Thyroid hormones Warfarin This list may not describe all possible interactions. Give your health care provider a list of all the medicines, herbs, non-prescription drugs, or dietary supplements you  use. Also tell them if you smoke, drink  alcohol, or use illegal drugs. Some items may interact with your medicine. What should I watch for while using this medication? Visit your care team for regular checks on your progress. Drink plenty of fluids while taking this medication. Check with your care team if you get an attack of severe diarrhea, nausea, and vomiting. The loss of too much body fluid can make it dangerous for you to take this medication. A test called the HbA1C (A1C) will be monitored. This is a simple blood test. It measures your blood sugar control over the last 2 to 3 months. You will receive this test every 3 to 6 months. Learn how to check your blood sugar. Learn the symptoms of low and high blood sugar and how to manage them. Always carry a quick-source of sugar with you in case you have symptoms of low blood sugar. Examples include hard sugar candy or glucose tablets. Make sure others know that you can choke if you eat or drink when you develop serious symptoms of low blood sugar, such as seizures or unconsciousness. They must get medical help at once. Tell your care team if you have high blood sugar. You might need to change the dose of your medication. If you are sick or exercising more than usual, you might need to change the dose of your medication. Do not skip meals. Ask your care team if you should avoid alcohol. Many nonprescription cough and cold products contain sugar or alcohol. These can affect blood sugar. Pens should never be shared. Even if the needle is changed, sharing may result in passing of viruses like hepatitis or HIV. Wear a medical ID bracelet or chain, and carry a card that describes your disease and details of your medication and dosage times. Birth control may not work properly while you are taking this medication. If you take birth control pills by mouth, your care team may recommend another type of birth control for 4 weeks after you start this medication and for 4 weeks after each increase in  your dose of this medication. Ask your care team which birth control methods you should use. What side effects may I notice from receiving this medication? Side effects that you should report to your care team as soon as possible: Allergic reactions--skin rash, itching, hives, swelling of the face, lips, tongue, or throat Change in vision Dehydration--increased thirst, dry mouth, feeling faint or lightheaded, headache, dark yellow or brown urine Gallbladder problems--severe stomach pain, nausea, vomiting, fever Kidney injury--decrease in the amount of urine, swelling of the ankles, hands, or feet Pancreatitis--severe stomach pain that spreads to your back or gets worse after eating or when touched, fever, nausea, vomiting Thyroid cancer--new mass or lump in the neck, pain or trouble swallowing, trouble breathing, hoarseness Side effects that usually do not require medical attention (report these to your care team if they continue or are bothersome): Constipation Diarrhea Loss of Appetite Nausea Stomach pain Upset stomach Vomiting This list may not describe all possible side effects. Call your doctor for medical advice about side effects. You may report side effects to FDA at 1-800-FDA-1088. Where should I keep my medication? Keep out of the reach of children and pets. Refrigeration (preferred): Store unopened pens in a refrigerator between 2 and 8 degrees C (36 and 46 degrees F). Keep it in the original carton until you are ready to take it. Do not freeze or use if the medication has been frozen.  Protect from light. Get rid of any unused medication after the expiration date on the label. Room Temperature: The pen may be stored at room temperature below 30 degrees C (86 degrees F) for up to a total of 21 days if needed. Protect from light. Avoid exposure to extreme heat. If it is stored at room temperature, throw away any unused medication after 21 days or after it expires, whichever is  first. The pen has glass parts. Handle it carefully. If you drop the pen on a hard surface, do not use it. Use a new pen for your injection. To get rid of medications that are no longer needed or have expired: Take the medication to a medication take-back program. Check with your pharmacy or law enforcement to find a location. If you cannot return the medication, ask your pharmacist or care team how to get rid of this medication safely. NOTE: This sheet is a summary. It may not cover all possible information. If you have questions about this medicine, talk to your doctor, pharmacist, or health care provider.  2022 Elsevier/Gold Standard (2020-05-19 00:00:00) Idiopathic Intracranial Hypertension Idiopathic intracranial hypertension (IIH) is a condition that increases pressure around the brain. The fluid that surrounds the brain and spinal cord (cerebrospinal fluid, or CSF) increases and causes the pressure. Idiopathic means that the cause of this condition is not known. IIH affects the brain and spinal cord (neurological disorder). If this condition is not treated, it can cause vision loss or blindness. What are the causes? The cause of this condition is not known. What increases the risk? The following factors may make you more likely to develop this condition: Being very overweight (obese). Being a female between the ages of 50 and 47 years old, who has not gone through menopause. Taking certain medicines, such as birth control or steroids. What are the signs or symptoms? Symptoms of this condition include: Headaches. This is the most common symptom. Brief episodes of total blindness. Double vision, blurred vision, or poor side (peripheral) vision. Pain in the shoulders or neck. Nausea and vomiting. A sound like rushing water or a pulsing sound within the ears (pulsatile tinnitus), or ringing in the ears. How is this diagnosed? This condition may be diagnosed based on: Your symptoms and  medical history. Imaging tests of the brain, such as: CT scan. MRI. Magnetic resonance venogram (MRV) to check the veins. Diagnostic lumbar puncture. This is a procedure to remove and examine a sample of cerebrospinal fluid. This procedure can determine whether too much fluid may be causing IIH. A thorough eye exam to check for swelling or nerve damage in the eyes. How is this treated? Treatment for this condition depends on the symptoms. The goal of treatment is to decrease the pressure around your brain. Common treatments include: Weight loss through healthy eating, salt restriction, and exercise, if you are overweight. Medicines to decrease the production of spinal fluid and lower the pressure within your skull. Medicines to prevent or treat headaches. Other treatments may include: Surgery to place drains (shunts) in your brain for removing excess fluid. Lumbar puncture to remove excess cerebrospinal fluid. Follow these instructions at home: If you are overweight or obese, work with your health care provider to lose weight. Take over-the-counter and prescription medicines only as told by your health care provider. Ask your health care provider if the medicine prescribed to you requires you to avoid driving or using machinery. Do not use any products that contain nicotine or tobacco, such as  cigarettes, e-cigarettes, and chewing tobacco. If you need help quitting, ask your health care provider. Keep all follow-up visits as told by your health care provider. This is important. Contact a health care provider if: You have changes in your vision, such as: Double vision. Blurred vision. Poor peripheral vision. Get help right away if: You have any of the following symptoms and they get worse or do not get better: Headaches. Nausea. Vomiting. Sudden trouble seeing. Summary Idiopathic intracranial hypertension (IIH) is a condition that increases pressure around the brain. The cause is not  known (is idiopathic). The most common symptom of IIH is headaches. Vision changes, pain in the shoulders or neck, nausea, and vomiting may also occur. Treatment for this condition depends on your symptoms. The goal of treatment is to decrease the pressure around your brain. If you are overweight or obese, work with your health care provider to lose weight. Take over-the-counter and prescription medicines only as told by your health care provider. This information is not intended to replace advice given to you by your health care provider. Make sure you discuss any questions you have with your health care provider. Document Revised: 11/30/2018 Document Reviewed: 11/30/2018 Elsevier Patient Education  2022 [employer].

## 2021-01-12 NOTE — Progress Notes (Addendum)
 [facility]    Provider:  Dr Rice Requesting Provider: Juliane Che, PA Primary Care Provider:  Juliane Che, PA  Virtual Visit via Video Note  I connected with [name] [facility] on 08/20/23 at  4:30 PM EST by a video enabled telemedicine application and verified that I am speaking with the correct person using two identifiers.  Location: Patient: home Provider: office   I discussed the limitations of evaluation and management by telemedicine and the availability of in person appointments. The patient expressed understanding and agreed to proceed.  Follow Up Instructions:    I discussed the assessment and treatment plan with the patient. The patient was provided an opportunity to ask questions and all were answered. The patient agreed with the plan and demonstrated an understanding of the instructions.   The patient was advised to call back or seek an in-person evaluation if the symptoms worsen or if the condition fails to improve as anticipated.  I provided 60 minutes of non-face-to-face time during this encounter.   Kelly KATHEE Ines, MD   CC:  headaches  HPI:  Kelly Rice is a 56 y.o. female here as requested by Juliane Che, PA for headache. This is a 56 year old patient relatively healthy who has a remote history of headache in her 66s and headaches.  Patient has gained 20 pounds during COVID, recent eye exam was normal with 20/20 vision, but I do think and increased weight are playing a role.  she has done on mounjaro  will refill.   Review of Systems: Patient complains of symptoms per HPI as well as the following symptoms none. Pertinent negatives and positives per HPI. All others negative.   Social History   Socioeconomic History   Marital status: Married    Spouse name: Not on file   Number of children: Not on file   Years of education: Not on file   Highest education level: Not on file  Occupational History   Not on file   Tobacco Use   Smoking status: Never   Smokeless tobacco: Never  Substance and Sexual Activity   Alcohol use: Yes    Comment: rarely    Drug use: Never   Sexual activity: Yes  Other Topics Concern   Not on file  Social History Narrative   Not on file   Social Drivers of Health   Financial Resource Strain: Low Risk  (06/13/2023)   Received from [facility]   Overall Financial Resource Strain (CARDIA)    Difficulty of Paying Living Expenses: Not hard at all  Food Insecurity: No Food Insecurity (06/13/2023)   Received from [facility]   Hunger Vital Sign    Within the past 12 months, you worried that your food would run out before you got the money to buy more.: Never true    Within the past 12 months, the food you bought just didn't last and you didn't have money to get more.: Never true  Transportation Needs: No Transportation Needs (06/13/2023)   Received from [facility] - Transportation    Lack of Transportation (Medical): No    Lack of Transportation (Non-Medical): No  Physical Activity: Sufficiently Active (06/13/2023)   Received from [facility]   Exercise Vital Sign    On average, how many days per week do you engage in moderate to strenuous exercise (like a brisk walk)?: 6 days    On average, how many minutes do you engage in exercise at this level?: 60 min  Stress: Stress Concern Present (06/13/2023)   Received from [facility] - Occupational Stress Questionnaire    Feeling of Stress : To some extent  Social Connections: Socially Integrated (06/13/2023)   Received from [facility]   Social Network    How would you rate your social network (family, work, friends)?: Good participation with social networks  Intimate Partner Violence: Not At Risk (06/13/2023)   Received from [facility]   HITS    Over the last 12 months how often did your partner physically hurt you?: Never    Over the last 12 months how  often did your partner insult you or talk down to you?: Never    Over the last 12 months how often did your partner threaten you with physical harm?: Never    Over the last 12 months how often did your partner scream or curse at you?: Never    Family History  Problem Relation Age of Onset   Colon cancer Neg Hx    Colon polyps Neg Hx    Esophageal cancer Neg Hx    Rectal cancer Neg Hx    Stomach cancer Neg Hx     Past Medical History:  Diagnosis Date   Arthritis    Depression     Patient Active Problem List   Diagnosis Date Noted   Wrist pain, right 08/23/2021   Left knee pain 06/21/2016   Hand pain, right first web space 05/11/2014   Acute meniscal tear of knee 02/14/2011   Metatarsalgia of both feet 04/21/2009   ABNORMALITY OF GAIT 04/21/2009    Past Surgical History:  Procedure Laterality Date   BREAST BIOPSY Left 04/07/2015   benign   BUNIONECTOMY     BIL   KNEE ARTHROSCOPY  03/08/2011   Procedure: ARTHROSCOPY KNEE;  Surgeon: Norleen LITTIE Gavel, MD;  Location: [facility];  Service: Orthopedics;  Laterality: Right;  Partial Medial Meniscectomy, Chondroplasty Patella, Medial Femoral Condyle Chondroplasty   KNEE ARTHROSCOPY Left 2017   TUBAL LIGATION      Current Outpatient Medications  Medication Sig Dispense Refill   azithromycin  (ZITHROMAX  Z-PAK) 250 MG tablet 500mg (2 tabs) PO on day one. Then 250mg (1 tab) daily for 4 additional days. 6 each 0   buPROPion  (WELLBUTRIN  XL) 300 MG 24 hr tablet TAKE 1 TABLET(300 MG) BY MOUTH DAILY 90 tablet 4   FLUoxetine  (PROZAC ) 40 MG capsule Take 1 capsule (40 mg total) by mouth daily. 90 capsule 4   ibuprofen (ADVIL,MOTRIN) 800 MG tablet TAKE 1 TABLET BY MOUTH EVERY 8 HOURS AS NEEDED     LORazepam  (ATIVAN ) 0.5 MG tablet Take 1 tablet (0.5 mg total) by mouth every 8 (eight) hours as needed for anxiety. 90 tablet 1   meloxicam  (MOBIC ) 15 MG tablet Take one pill a day with food for 7 days and then as needed thereafter 40  tablet 0   ondansetron  (ZOFRAN -ODT) 4 MG disintegrating tablet Take 1 tablet (4 mg total) by mouth every 6 (six) hours as needed for nausea. 30 tablet 3   propranolol  (INDERAL ) 10 MG tablet Take 1 tablet (10 mg total) by mouth 3 (three) times daily. 90 tablet 3   tirzepatide  (MOUNJARO ) 5 MG/0.5ML Pen Inject 5 mg into the skin once a week. 6 mL 11   tirzepatide  (ZEPBOUND ) 5 MG/0.5ML Pen Inject 5 mg into the skin once a week. 2 mL 5   No current facility-administered medications for this visit.  Allergies as of 01/12/2021 - Review Complete 12/20/2020  Allergen Reaction Noted   Codeine Nausea Only 02/20/2011   Penicillins Rash 02/20/2011    Vitals: LMP 03/08/2016  Last Weight:  Wt Readings from Last 1 Encounters:  06/14/21 130 lb (59 kg)   Last Height:   Ht Readings from Last 1 Encounters:  08/23/21 5' 4 (1.626 m)     Physical exam: Exam: Gen: NAD, conversant      CV: attempted, Could not perform over Web Video. Denies palpitations or chest pain or SOB. VS: Breathing at a normal rate. Weight appears within normal limits. Not febrile. Eyes: Conjunctivae clear without exudates or hemorrhage  Neuro: Detailed Neurologic Exam  Speech:    Speech is normal; fluent and spontaneous with normal comprehension.  Cognition:    The patient is oriented to person, place, and time;     recent and remote memory intact;     language fluent;     normal attention, concentration,     fund of knowledge Cranial Nerves:    The pupils are equal, round, and reactive to light. Attempted, Cannot perform fundoscopic exam. Visual fields are full to finger confrontation. Extraocular movements are intact.  The face is symmetric with normal sensation. The palate elevates in the midline. Hearing intact. Voice is normal. Shoulder shrug is normal. The tongue has normal motion without fasciculations.   Coordination:    Normal finger to nose  Gait:    Normal native gait  Motor Observation:   no  involuntary movements noted. Tone:    Appears normal  Posture:    Posture is normal. normal erect    Strength:    Strength is anti-gravity and symmetric in the upper and lower limbs.      Sensation: intact to LT     Reflex Exam:  DTR's:    Attempted, Could not perform over Web Video   Toes: Attempted Could not perform over Web Video  Clonus:   Attempted, Could not perform over Web Video       Assessment/Plan:  This is a 56 year old patient relatively healthy who has a remote history of headache in her 36s and headaches.  .   Headache: weight loss, f/u 4 months    No orders of the defined types were placed in this encounter.  Meds ordered this encounter  Medications   DISCONTD: tirzepatide  (MOUNJARO ) 5 MG/0.5ML Pen    Sig: Inject 5 mg into the skin once a week.    Dispense:  2 mL    Refill:  6    On mounjaro  for 4 months for DM2. new insurance may need PA. Failed saxenda, ozempic, trulicity, metformin. Let's see if the copay card works: RXBIN [id number] group [id number] ID [id number]?    Cc: Juliane Che, PA,  [place], [place], [place]  Kelly Epp, MD  [facility] [address] [place], [place] [address]  Phone [phone] Fax [phone]  I spent 60 minutes of face-to-face and non-face-to-face time with patient on the  1. Nonintractable headache, unspecified chronicity pattern, unspecified headache type    diagnosis.  This included previsit chart review, lab review, study review, order entry, electronic health record documentation, patient education on the different diagnostic and therapeutic options, counseling and coordination of care, risks and benefits of management, compliance, or risk factor reduction

## 2021-01-13 ENCOUNTER — Other Ambulatory Visit (HOSPITAL_COMMUNITY): Payer: Self-pay

## 2021-01-25 ENCOUNTER — Other Ambulatory Visit (HOSPITAL_COMMUNITY): Payer: Self-pay

## 2021-02-14 ENCOUNTER — Other Ambulatory Visit (HOSPITAL_COMMUNITY): Payer: Self-pay

## 2021-02-25 ENCOUNTER — Other Ambulatory Visit (HOSPITAL_COMMUNITY): Payer: Self-pay

## 2021-02-28 ENCOUNTER — Other Ambulatory Visit: Payer: Self-pay | Admitting: Neurology

## 2021-02-28 MED ORDER — MOUNJARO 5 MG/0.5ML ~~LOC~~ SOAJ
5.0000 mg | SUBCUTANEOUS | 1 refills | Status: DC
Start: 2021-02-28 — End: 2021-10-31
  Filled 2021-02-28 – 2021-03-18 (×2): qty 6, 84d supply, fill #0
  Filled 2021-06-19: qty 6, 84d supply, fill #1

## 2021-03-01 ENCOUNTER — Other Ambulatory Visit (HOSPITAL_COMMUNITY): Payer: Self-pay

## 2021-03-18 ENCOUNTER — Other Ambulatory Visit (HOSPITAL_COMMUNITY): Payer: Self-pay

## 2021-03-21 ENCOUNTER — Other Ambulatory Visit (HOSPITAL_COMMUNITY): Payer: Self-pay

## 2021-06-09 ENCOUNTER — Ambulatory Visit
Admission: RE | Admit: 2021-06-09 | Discharge: 2021-06-09 | Disposition: A | Discharge status: Home / Self Care | Payer: No Typology Code available for payment source | Source: Ambulatory Visit | Attending: Sports Medicine | Admitting: Sports Medicine

## 2021-06-09 ENCOUNTER — Ambulatory Visit (INDEPENDENT_AMBULATORY_CARE_PROVIDER_SITE_OTHER): Payer: No Typology Code available for payment source | Admitting: Sports Medicine

## 2021-06-09 VITALS — BP 120/78 | Ht 64.0 in | Wt 130.0 lb

## 2021-06-09 DIAGNOSIS — M25511 Pain in right shoulder: Secondary | ICD-10-CM | POA: Diagnosis not present

## 2021-06-09 NOTE — Progress Notes (Signed)
PCP: Harrison Mons, PA  Subjective:   HPI: Patient is a 56 y.o. female here for right shoulder pain for the last 6 months that she describes as catching, "failing", and weakness.  States that it is worse with overhead activities and pushing down.  No previous injuries to the right shoulder before.  It is not painful but it feels limiting.  She has some discomfort at night when rolling on it.  She did have a bone spur removal by Dr. Ronnie Derby in Feb 2022.   Pre-op MR of her right shoulder in 01/2020 showed moderate supraspinatus and mild infraspinatus and subscapularis tendinopathy.  Small partial-thickness tear of the distal anterior supraspinatus tendon.  Mild subacromial subdeltoid bursitis.  Moderate AC joint arthropathy, mild degenerative chondral thickening in the glenohumeral joint.  Past Medical History:  Diagnosis Date   Arthritis    Depression     Current Outpatient Medications on File Prior to Visit  Medication Sig Dispense Refill   azithromycin (ZITHROMAX Z-PAK) 250 MG tablet '500mg'$ (2 tabs) PO on day one. Then '250mg'$ (1 tab) daily for 4 additional days. 6 each 0   buPROPion (WELLBUTRIN XL) 300 MG 24 hr tablet TAKE 1 TABLET(300 MG) BY MOUTH DAILY 90 tablet 4   FLUoxetine (PROZAC) 40 MG capsule Take 1 capsule (40 mg total) by mouth daily. 90 capsule 4   ibuprofen (ADVIL,MOTRIN) 800 MG tablet TAKE 1 TABLET BY MOUTH EVERY 8 HOURS AS NEEDED     LORazepam (ATIVAN) 0.5 MG tablet Take 1 tablet (0.5 mg total) by mouth every 8 (eight) hours as needed for anxiety. 90 tablet 1   ondansetron (ZOFRAN-ODT) 4 MG disintegrating tablet Take 1 tablet (4 mg total) by mouth every 6 (six) hours as needed for nausea. 30 tablet 3   propranolol (INDERAL) 10 MG tablet Take 1 tablet (10 mg total) by mouth 3 (three) times daily. 90 tablet 3   tirzepatide (MOUNJARO) 5 MG/0.5ML Pen Inject 5 mg into the skin once a week. 6 mL 1   No current facility-administered medications on file prior to visit.    Past  Surgical History:  Procedure Laterality Date   BREAST BIOPSY Left 04/07/2015   benign   BUNIONECTOMY     BIL   KNEE ARTHROSCOPY  03/08/2011   Procedure: ARTHROSCOPY KNEE;  Surgeon: Alta Corning, MD;  Location: [facility];  Service: Orthopedics;  Laterality: Right;  Partial Medial Meniscectomy, Chondroplasty Patella, Medial Femoral Condyle Chondroplasty   KNEE ARTHROSCOPY Left 2017   TUBAL LIGATION      Allergies  Allergen Reactions   Codeine Nausea Only   Penicillins Rash    BP 120/78   Ht '5\' 4"'$  (1.626 m)   Wt 130 lb (59 kg)   LMP 03/08/2016   BMI 22.31 kg/m      06/09/2021    8:29 AM  [facility]  Frequency of aerobic exercise (# of days/week) 6  Average time in minutes 75  Frequency of strengthening activities (# of days/week) 3        No data to display              Objective:  Physical Exam:  Gen: NAD, comfortable in exam room Right Shoulder: Inspection reveals no obvious deformity, atrophy, or asymmetry b/l. No bruising. No swelling Palpation is normal with no TTP over [facility] joint or bicipital groove b/l. Full ROM in flexion, abduction, internal/external rotation b/l; slight hypermobility NV intact distally b/l Normal scapular function observed b/l  Special Tests:  - Impingement: Mild discomfort with Hawkins, neers, empty can sign. - Supraspinatous: Negative empty can - Infraspinatous/Teres Minor: 5/5 strength with ER - Subscapularis: 5/5 strength with IR - Labrum: Negative Obriens, good stability - No painful arc and no drop arm sign    Assessment & Plan:  1. Right shoulder pain, unspecified chronicity Likely 2/2 rotator cuff irritation/inflammation.  She does have a component of hypermobility which could be affecting her right shoulder.  Also could be multifactorial with glenohumeral arthritis affecting. Discussed avoiding any overhead strength exercises. Discussed formal PT vs home exercises, will opt for the  latter at this time. Repeat x-rays to check on post-op changes and will have patient schedule up for U/S next week. Defer MRI for now.  - DG Shoulder Right; Future  Patient seen and evaluated with the resident.  I agree with the above plan of care.  Patient is status post right shoulder arthroscopy in early 2022 for debridement.  Some of her symptoms may be originating from residual rotator cuff tendinopathy or possibly from the mild glenohumeral osteoarthritis seen on her preoperative MRI.  I will start with getting updated x-rays of her shoulder and she will follow-up with me early next week for complete right shoulder ultrasound.  We will delineate a more definitive treatment plan based on those results.  I do anticipate physical therapy as being part of that treatment plan.

## 2021-06-14 ENCOUNTER — Ambulatory Visit: Payer: Self-pay

## 2021-06-14 ENCOUNTER — Ambulatory Visit (INDEPENDENT_AMBULATORY_CARE_PROVIDER_SITE_OTHER): Payer: No Typology Code available for payment source | Admitting: Sports Medicine

## 2021-06-14 VITALS — BP 112/76 | Ht 64.0 in | Wt 130.0 lb

## 2021-06-14 DIAGNOSIS — M25511 Pain in right shoulder: Secondary | ICD-10-CM | POA: Diagnosis not present

## 2021-06-14 NOTE — Progress Notes (Signed)
Patient ID: Kelly Rice, female   DOB: 1965/11/10, 56 y.o.   MRN: [id number]   [name] presents today for an ultrasound of the right shoulder.  Recent x-rays showed some mild glenohumeral DJD.  Ultrasound performed as below.  Based on these findings I recommended a home exercise program concentrating on rotator cuff strengthening and scapular stabilization.  Reassurance regarding her x-ray and ultrasound findings.  I think she can continue with activity using pain as her guide.  Follow-up with me as needed.  Right shoulder ultrasound:  Biceps tendon was well visualized in the long and short axis without abnormality.  Subscapularis visualized well in the long axis without abnormality.  Supraspinatus is well visualized.  There is a very tiny amount of calcification within the distalmost portion of the anterior leading edge but no obvious tear seen.  Infraspinatus well visualized without abnormality.  AC joint also well visualized without any signs of fluid.  Findings are consistent with very minimal calcific rotator cuff tendinopathy.  This note was dictated using Dragon naturally speaking software and may contain errors in syntax, spelling, or content which have not been identified prior to signing this note.

## 2021-06-20 ENCOUNTER — Other Ambulatory Visit (HOSPITAL_COMMUNITY): Payer: Self-pay

## 2021-08-19 ENCOUNTER — Ambulatory Visit: Payer: No Typology Code available for payment source | Admitting: Family Medicine

## 2021-08-23 ENCOUNTER — Other Ambulatory Visit: Payer: Self-pay | Admitting: Physician Assistant

## 2021-08-23 ENCOUNTER — Ambulatory Visit: Payer: No Typology Code available for payment source | Admitting: Sports Medicine

## 2021-08-23 ENCOUNTER — Other Ambulatory Visit (HOSPITAL_COMMUNITY): Payer: Self-pay

## 2021-08-23 VITALS — BP 115/75 | Ht 64.0 in

## 2021-08-23 DIAGNOSIS — S56911A Strain of unspecified muscles, fascia and tendons at forearm level, right arm, initial encounter: Secondary | ICD-10-CM | POA: Diagnosis not present

## 2021-08-23 DIAGNOSIS — Z1231 Encounter for screening mammogram for malignant neoplasm of breast: Secondary | ICD-10-CM

## 2021-08-23 DIAGNOSIS — M25531 Pain in right wrist: Secondary | ICD-10-CM

## 2021-08-23 MED ORDER — MELOXICAM 15 MG PO TABS
ORAL_TABLET | ORAL | 0 refills | Status: AC
  Filled 2021-08-23: qty 40, 40d supply, fill #0

## 2021-08-23 NOTE — Assessment & Plan Note (Signed)
Likely flexor tendinitis due to tennis.  No red flags, no need for imaging at this time.  Conservative management discussed.  Will trial meloxicam x7 days and wrist wrap.  Prescribed physical therapy.  Return as needed if no improvement.

## 2021-08-23 NOTE — Progress Notes (Unsigned)
    SUBJECTIVE:   CHIEF COMPLAINT / HPI:   Right forearm and wrist discomfort Ms. Romack is a pleasant 56 year old female who presents today with a complaint of right forearm and wrist pain.  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  Previously involved medial elbow, however that is now improved s/p icing.  The pain in radial forearm and wrist persists.  This pain is sharp in nature, exacerbated by applied force, such as when she incision is well.  She feels as though her strength is decreased due to this.  Denies numbness or tingling of the fingers.  Has attempted to use wrist brace without much relief.  Will improve with rest, but returns day or so after resuming tennis.  PERTINENT  PMH / PSH: Bilateral metatarsalgia, right meniscal tear, arthritis, left knee arthroscopy, right shoulder arthroscopy 2022 for debridement  OBJECTIVE:   BP 115/75   Ht '5\' 4"'$  (1.626 m)   LMP 03/08/2016   BMI 22.31 kg/m    Right upper extremity Inspection reveals no gross deformities, swelling, or bruising, equal to left Nontender to palpation Neurovascularly intact, sensation equal Grip strength and push/pull 5/5 and equal Pain with resisted pronation, nontender to resisted supination Nontender to resisted wrist extension  ASSESSMENT/PLAN:   Wrist pain, right Likely flexor tendinitis due to tennis.  No red flags, no need for imaging at this time.  Conservative management discussed.  Will trial meloxicam x7 days and wrist wrap.  Prescribed physical therapy.  Return as needed if no improvement.     Ezequiel Essex, MD [place]   Patient seen and evaluated with the resident.  I agree with the above plan of care.  Patient will start meloxicam 15 mg daily for 7 days.  She will also start some physical therapy.  I think she may continue with activity, including tennis, using pain as her guide.  I also recommended that she try a wrist wrap  with tennis.  She did try this on in the office today and found it to be comfortable and thinks that it may be beneficial.  Follow-up for ongoing or recalcitrant issues.

## 2021-09-19 ENCOUNTER — Ambulatory Visit: Payer: No Typology Code available for payment source

## 2021-10-10 ENCOUNTER — Ambulatory Visit
Admission: RE | Admit: 2021-10-10 | Discharge: 2021-10-10 | Disposition: A | Discharge status: Home / Self Care | Payer: No Typology Code available for payment source | Source: Ambulatory Visit | Attending: Physician Assistant | Admitting: Physician Assistant

## 2021-10-10 DIAGNOSIS — Z1231 Encounter for screening mammogram for malignant neoplasm of breast: Secondary | ICD-10-CM

## 2021-10-31 ENCOUNTER — Other Ambulatory Visit (HOSPITAL_COMMUNITY): Payer: Self-pay

## 2021-10-31 ENCOUNTER — Other Ambulatory Visit: Payer: Self-pay | Admitting: Neurology

## 2021-10-31 MED ORDER — MOUNJARO 5 MG/0.5ML ~~LOC~~ SOAJ
5.0000 mg | SUBCUTANEOUS | 1 refills | Status: DC
  Filled 2021-10-31: qty 6, 84d supply, fill #0
  Filled 2022-01-05: qty 2, 28d supply, fill #1
  Filled 2022-01-27 – 2022-01-30 (×2): qty 6, 84d supply, fill #1

## 2022-01-14 ENCOUNTER — Other Ambulatory Visit: Payer: Self-pay

## 2022-01-20 ENCOUNTER — Other Ambulatory Visit (HOSPITAL_COMMUNITY): Payer: Self-pay

## 2022-01-24 ENCOUNTER — Other Ambulatory Visit (HOSPITAL_COMMUNITY): Payer: Self-pay

## 2022-01-30 ENCOUNTER — Other Ambulatory Visit (HOSPITAL_COMMUNITY): Payer: Self-pay

## 2022-02-02 ENCOUNTER — Other Ambulatory Visit: Payer: Self-pay | Admitting: Neurology

## 2022-02-02 ENCOUNTER — Other Ambulatory Visit (HOSPITAL_COMMUNITY): Payer: Self-pay

## 2022-02-02 DIAGNOSIS — E669 Obesity, unspecified: Secondary | ICD-10-CM

## 2022-02-02 MED ORDER — ZEPBOUND 5 MG/0.5ML ~~LOC~~ SOAJ
5.0000 mg | SUBCUTANEOUS | 11 refills | Status: DC
  Filled 2022-02-02: qty 2, 28d supply, fill #0

## 2022-02-03 ENCOUNTER — Other Ambulatory Visit (HOSPITAL_COMMUNITY): Payer: Self-pay

## 2022-02-06 ENCOUNTER — Other Ambulatory Visit (HOSPITAL_COMMUNITY): Payer: Self-pay

## 2022-02-07 ENCOUNTER — Other Ambulatory Visit (HOSPITAL_COMMUNITY): Payer: Self-pay

## 2022-02-08 ENCOUNTER — Other Ambulatory Visit (HOSPITAL_COMMUNITY): Payer: Self-pay

## 2022-02-08 ENCOUNTER — Other Ambulatory Visit: Payer: Self-pay | Admitting: Neurology

## 2022-02-08 DIAGNOSIS — E7849 Other hyperlipidemia: Secondary | ICD-10-CM

## 2022-02-08 DIAGNOSIS — E1169 Type 2 diabetes mellitus with other specified complication: Secondary | ICD-10-CM

## 2022-02-08 MED ORDER — MOUNJARO 5 MG/0.5ML ~~LOC~~ SOAJ
5.0000 mg | SUBCUTANEOUS | 11 refills | Status: AC
  Filled 2022-02-08: qty 2, 28d supply, fill #0
  Filled 2022-03-15: qty 2, 28d supply, fill #1
  Filled 2022-04-12: qty 2, 28d supply, fill #2
  Filled 2022-05-18: qty 2, 28d supply, fill #3
  Filled 2022-06-14: qty 2, 28d supply, fill #4
  Filled 2022-07-12: qty 2, 28d supply, fill #5
  Filled 2022-08-09: qty 2, 28d supply, fill #6
  Filled 2022-08-27 – 2022-09-08 (×2): qty 2, 28d supply, fill #7
  Filled 2022-10-04: qty 2, 28d supply, fill #8
  Filled 2022-11-02: qty 2, 28d supply, fill #9
  Filled 2022-12-01: qty 2, 28d supply, fill #10
  Filled 2022-12-28: qty 2, 28d supply, fill #11
  Filled 2023-02-05: qty 2, 28d supply, fill #12

## 2022-02-08 MED ORDER — MOUNJARO 5 MG/0.5ML ~~LOC~~ SOAJ: 5.0000 mg | SUBCUTANEOUS | 11 refills | Status: DC

## 2022-02-08 MED ORDER — MOUNJARO 5 MG/0.5ML ~~LOC~~ SOAJ
5.0000 mg | SUBCUTANEOUS | 11 refills | Status: DC
  Filled 2022-02-08: qty 6, 84d supply, fill #0

## 2022-02-10 ENCOUNTER — Other Ambulatory Visit (HOSPITAL_COMMUNITY): Payer: Self-pay

## 2022-02-13 ENCOUNTER — Other Ambulatory Visit: Payer: Self-pay | Admitting: Neurology

## 2022-02-13 ENCOUNTER — Other Ambulatory Visit (HOSPITAL_COMMUNITY): Payer: Self-pay

## 2022-02-14 ENCOUNTER — Other Ambulatory Visit (HOSPITAL_COMMUNITY): Payer: Self-pay

## 2022-03-30 ENCOUNTER — Ambulatory Visit
Admission: RE | Admit: 2022-03-30 | Discharge: 2022-03-30 | Disposition: A | Discharge status: Home / Self Care | Payer: Managed Care, Other (non HMO) | Source: Ambulatory Visit | Attending: Chiropractic Medicine | Admitting: Chiropractic Medicine

## 2022-03-30 ENCOUNTER — Other Ambulatory Visit: Payer: Self-pay | Admitting: Chiropractic Medicine

## 2022-03-30 DIAGNOSIS — M159 Polyosteoarthritis, unspecified: Secondary | ICD-10-CM

## 2022-04-10 ENCOUNTER — Other Ambulatory Visit: Payer: Self-pay | Admitting: Chiropractic Medicine

## 2022-04-10 DIAGNOSIS — G8929 Other chronic pain: Secondary | ICD-10-CM

## 2022-04-23 ENCOUNTER — Ambulatory Visit
Admission: RE | Admit: 2022-04-23 | Discharge: 2022-04-23 | Disposition: A | Discharge status: Home / Self Care | Payer: Managed Care, Other (non HMO) | Source: Ambulatory Visit | Attending: Chiropractic Medicine | Admitting: Chiropractic Medicine

## 2022-04-23 DIAGNOSIS — G8929 Other chronic pain: Secondary | ICD-10-CM

## 2022-06-16 ENCOUNTER — Other Ambulatory Visit (HOSPITAL_COMMUNITY): Payer: Self-pay

## 2022-08-28 ENCOUNTER — Other Ambulatory Visit (HOSPITAL_COMMUNITY): Payer: Self-pay

## 2022-09-11 ENCOUNTER — Other Ambulatory Visit (HOSPITAL_COMMUNITY): Payer: Self-pay

## 2022-09-19 ENCOUNTER — Other Ambulatory Visit: Payer: Self-pay | Admitting: Physician Assistant

## 2022-09-19 DIAGNOSIS — Z1231 Encounter for screening mammogram for malignant neoplasm of breast: Secondary | ICD-10-CM

## 2022-10-04 ENCOUNTER — Other Ambulatory Visit (HOSPITAL_COMMUNITY): Payer: Self-pay

## 2022-10-12 ENCOUNTER — Ambulatory Visit
Admission: RE | Admit: 2022-10-12 | Discharge: 2022-10-12 | Disposition: A | Discharge status: Home / Self Care | Payer: Managed Care, Other (non HMO) | Source: Ambulatory Visit | Attending: Physician Assistant | Admitting: Physician Assistant

## 2022-10-12 DIAGNOSIS — Z1231 Encounter for screening mammogram for malignant neoplasm of breast: Secondary | ICD-10-CM

## 2022-12-04 ENCOUNTER — Other Ambulatory Visit (HOSPITAL_COMMUNITY): Payer: Self-pay

## 2023-02-05 ENCOUNTER — Other Ambulatory Visit (HOSPITAL_COMMUNITY): Payer: Self-pay

## 2023-02-05 ENCOUNTER — Encounter (HOSPITAL_COMMUNITY): Payer: Self-pay

## 2023-07-26 ENCOUNTER — Other Ambulatory Visit (HOSPITAL_BASED_OUTPATIENT_CLINIC_OR_DEPARTMENT_OTHER): Payer: Self-pay

## 2023-07-26 MED ORDER — ZEPBOUND 5 MG/0.5ML ~~LOC~~ SOAJ
5.0000 mg | SUBCUTANEOUS | 5 refills | Status: DC
  Filled 2023-07-30 – 2023-08-08 (×2): qty 2, 28d supply, fill #0

## 2023-07-30 ENCOUNTER — Other Ambulatory Visit (HOSPITAL_BASED_OUTPATIENT_CLINIC_OR_DEPARTMENT_OTHER): Payer: Self-pay

## 2023-08-07 ENCOUNTER — Other Ambulatory Visit (HOSPITAL_BASED_OUTPATIENT_CLINIC_OR_DEPARTMENT_OTHER): Payer: Self-pay

## 2023-08-08 ENCOUNTER — Other Ambulatory Visit (HOSPITAL_BASED_OUTPATIENT_CLINIC_OR_DEPARTMENT_OTHER): Payer: Self-pay

## 2023-08-10 ENCOUNTER — Other Ambulatory Visit (HOSPITAL_BASED_OUTPATIENT_CLINIC_OR_DEPARTMENT_OTHER): Payer: Self-pay

## 2023-08-14 ENCOUNTER — Encounter (HOSPITAL_BASED_OUTPATIENT_CLINIC_OR_DEPARTMENT_OTHER): Payer: Self-pay

## 2023-08-14 ENCOUNTER — Other Ambulatory Visit (HOSPITAL_BASED_OUTPATIENT_CLINIC_OR_DEPARTMENT_OTHER): Payer: Self-pay

## 2023-08-20 ENCOUNTER — Other Ambulatory Visit: Payer: Self-pay | Admitting: Neurology

## 2023-08-20 DIAGNOSIS — Z7689 Persons encountering health services in other specified circumstances: Secondary | ICD-10-CM

## 2023-08-20 MED ORDER — TIRZEPATIDE-WEIGHT MANAGEMENT 5 MG/0.5ML ~~LOC~~ SOLN: 5.0000 mg | SUBCUTANEOUS | 11 refills | Status: AC

## 2023-08-22 ENCOUNTER — Other Ambulatory Visit (HOSPITAL_BASED_OUTPATIENT_CLINIC_OR_DEPARTMENT_OTHER): Payer: Self-pay

## 2023-10-09 IMAGING — DX DG SHOULDER 2+V*R*
3 series · 3 of 3 positions shown · non-contrast
Comparison: None Available.

CLINICAL DATA: Right shoulder pain.  No known injury.

EXAM:
RIGHT SHOULDER - 2+ VIEW

[dg shoulder right (1 of 3)]
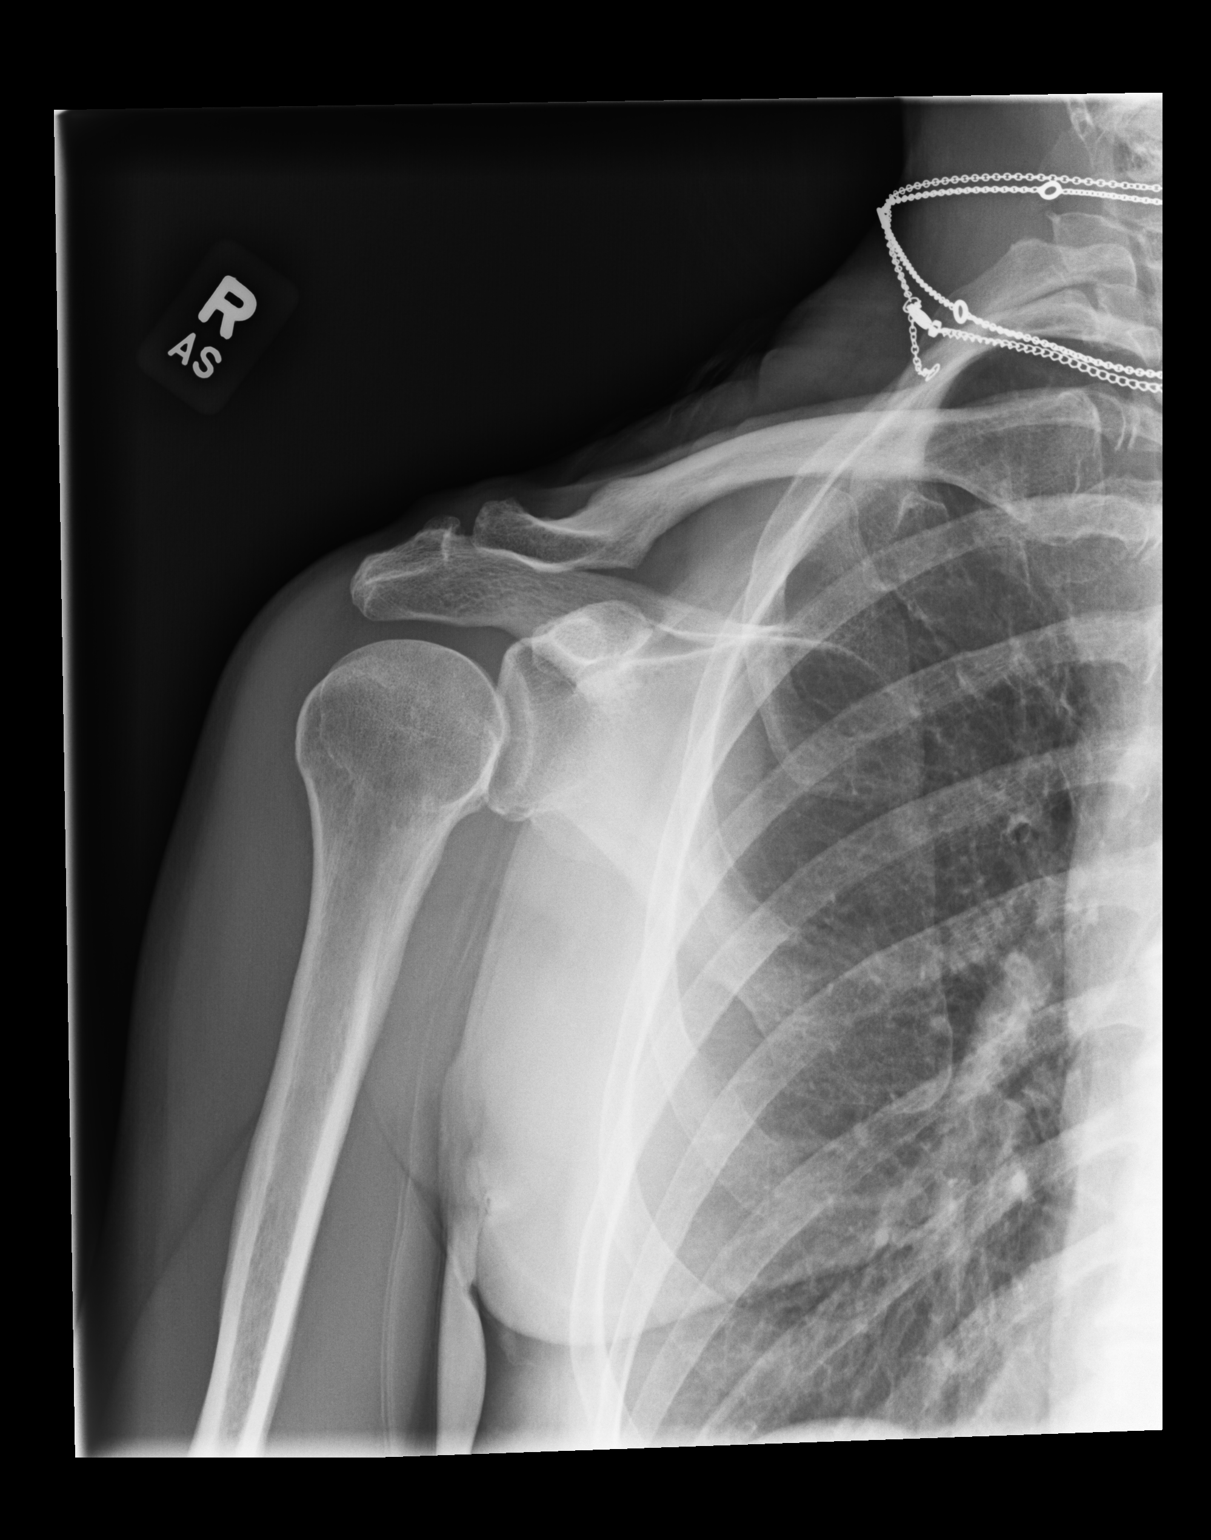

[dg shoulder right (2 of 3)]
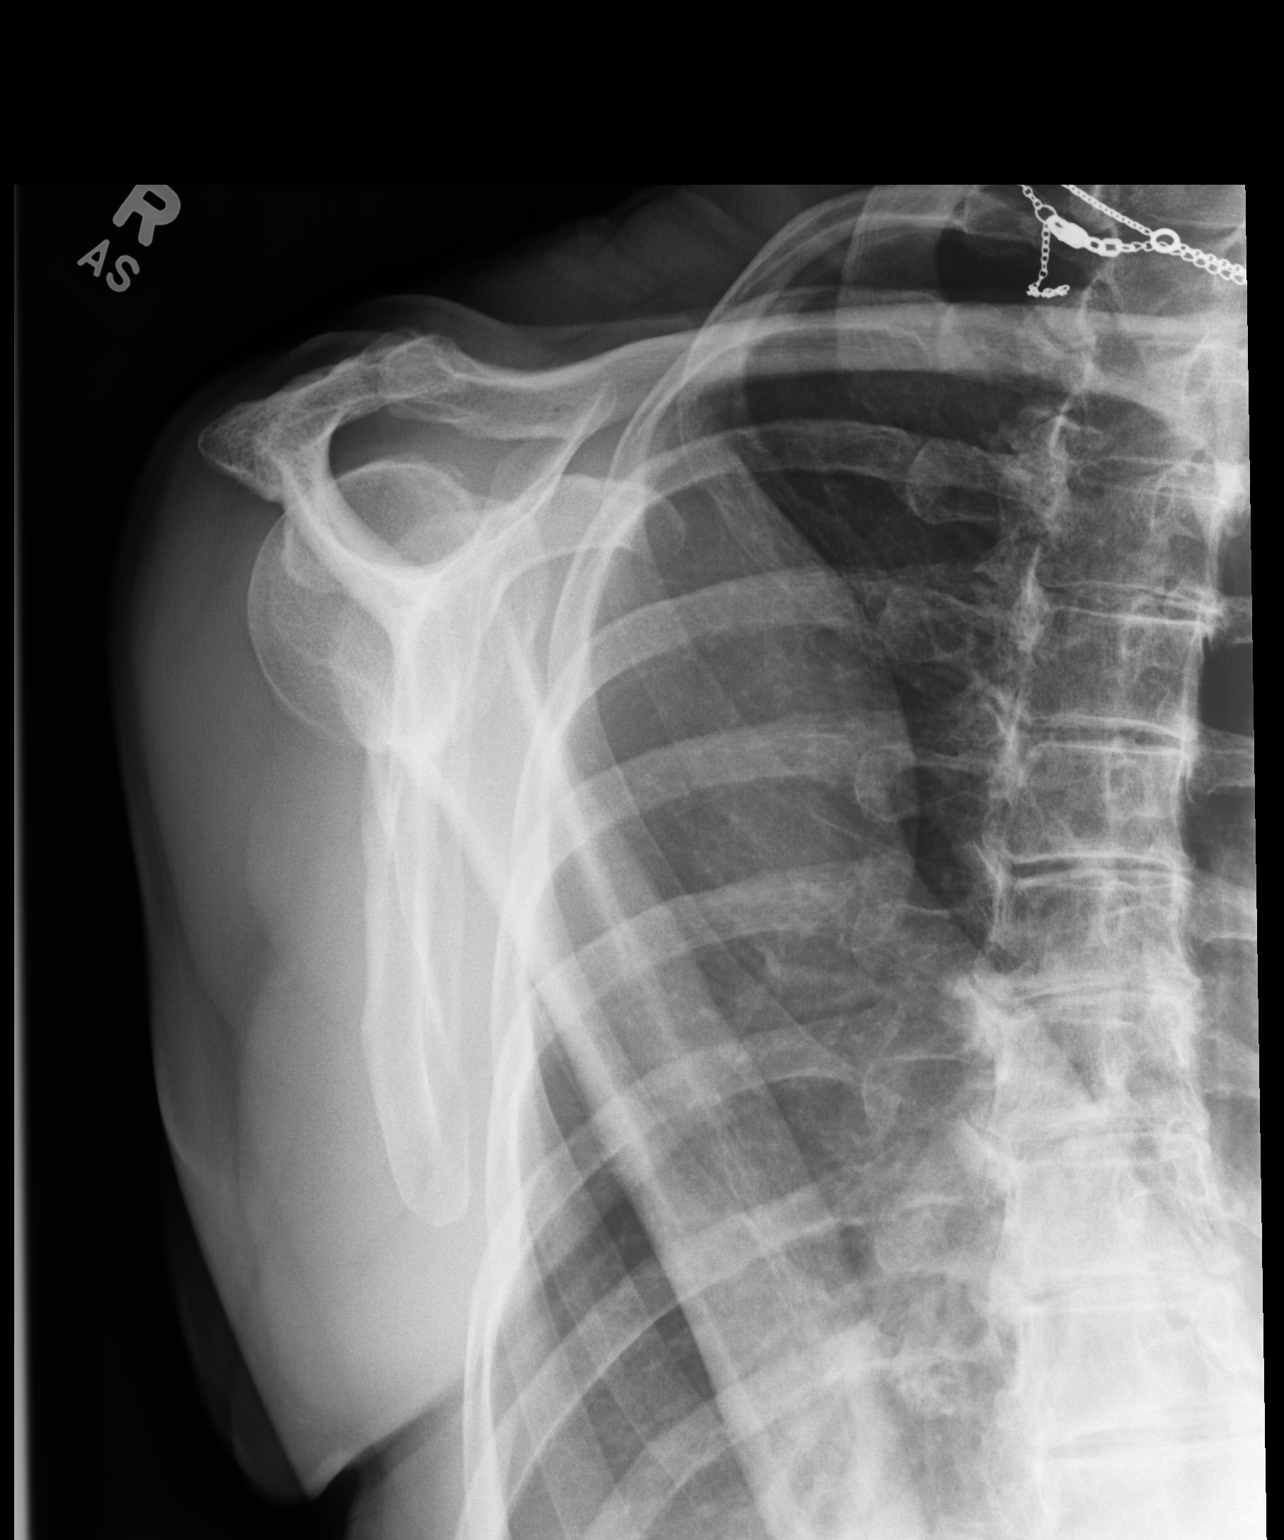

[dg shoulder right (3 of 3)]
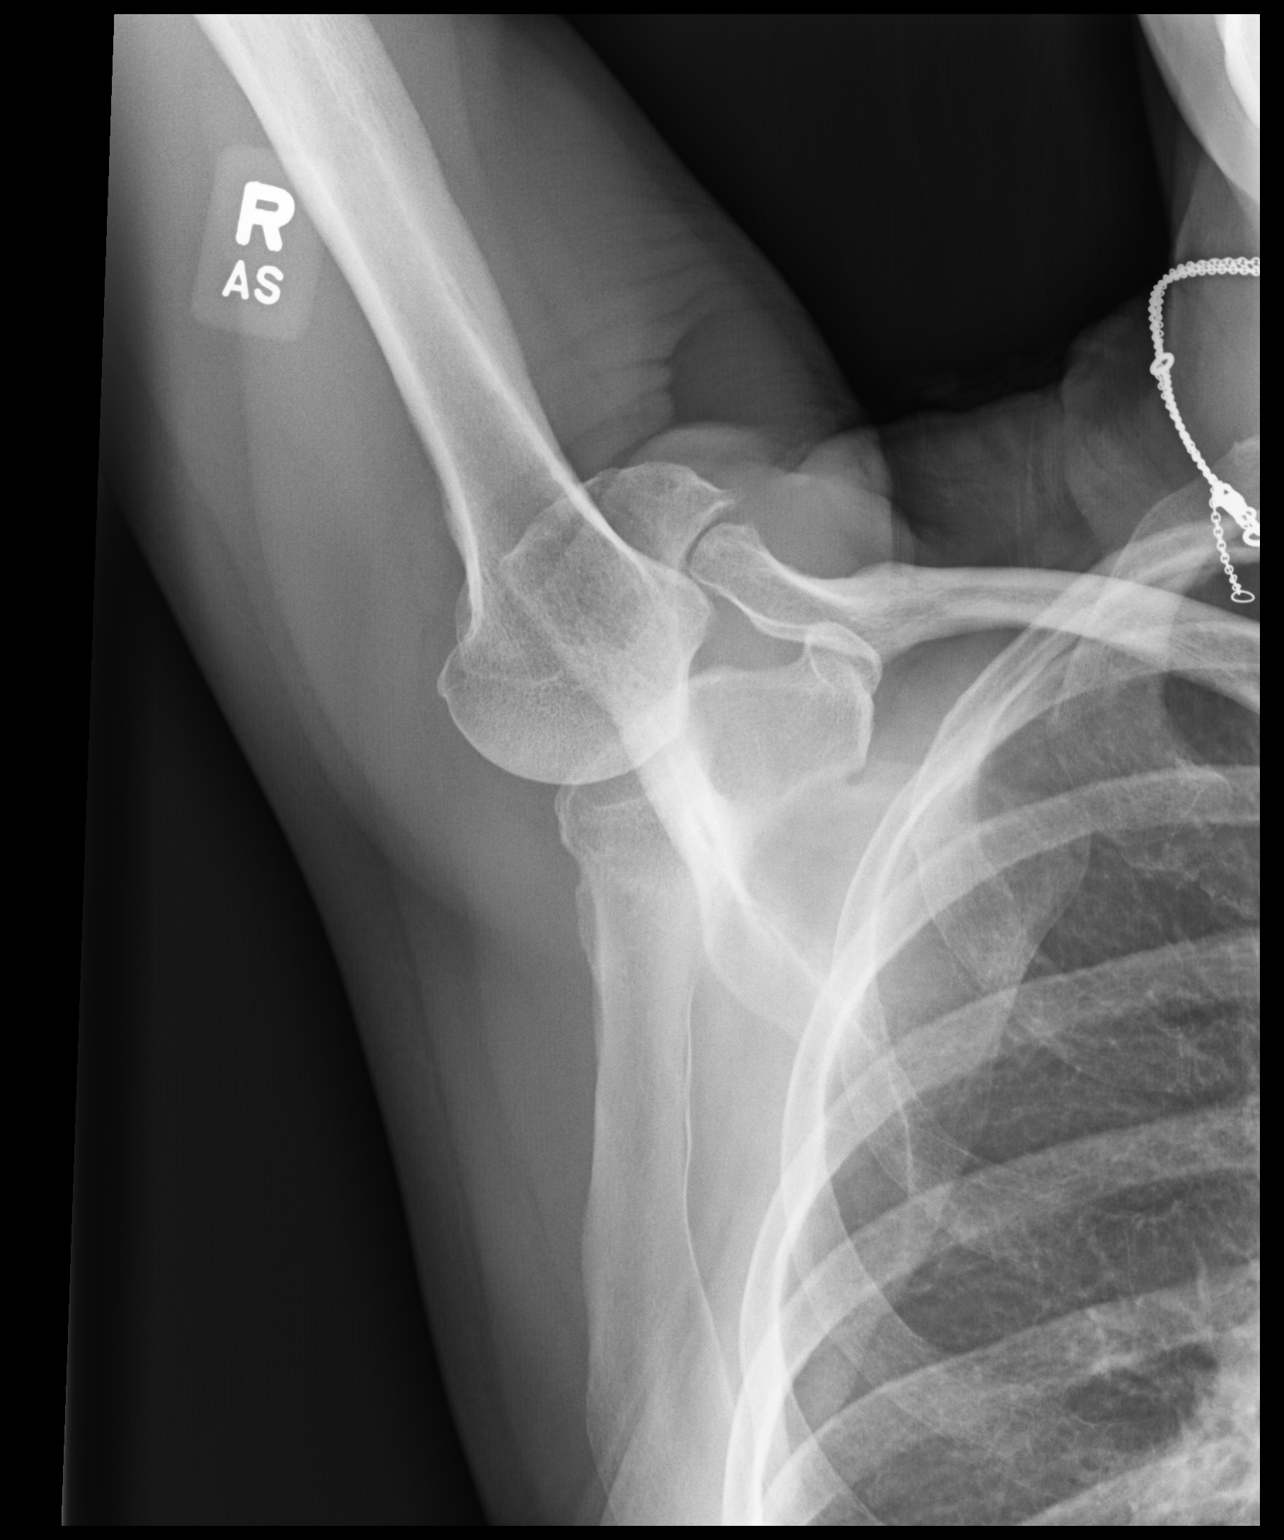

[3 of 3 positions shown; findings below may reference images not displayed]

FINDINGS: Mild superior acromioclavicular degenerative osteophytosis with mild
joint space narrowing seen on axillary view. Mild peripheral glenoid
degenerative osteophytes. No acute fracture or dislocation. The
visualized portion of the right lung is unremarkable.
IMPRESSION: Mild acromioclavicular and glenohumeral osteoarthritis.

## 2023-10-17 ENCOUNTER — Other Ambulatory Visit: Payer: Self-pay | Admitting: Physician Assistant

## 2023-10-17 DIAGNOSIS — Z1231 Encounter for screening mammogram for malignant neoplasm of breast: Secondary | ICD-10-CM

## 2023-10-19 ENCOUNTER — Ambulatory Visit: Admission: RE | Admit: 2023-10-19 | Discharge: 2023-10-19 | Disposition: A | Source: Ambulatory Visit

## 2023-10-19 DIAGNOSIS — Z1231 Encounter for screening mammogram for malignant neoplasm of breast: Secondary | ICD-10-CM
